# Patient Record
Sex: Female | Born: 1985 | Race: Black or African American | Hispanic: No | Marital: Married | Smoking: Never smoker
Health system: Southern US, Community
[De-identification: ages and names within clinical notes are randomized; demographics above are authoritative.]

## PROBLEM LIST (undated history)

## (undated) DIAGNOSIS — Z789 Other specified health status: Secondary | ICD-10-CM

## (undated) HISTORY — PX: NO PAST SURGERIES: SHX2092

---
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
## 2007-10-17 ENCOUNTER — Inpatient Hospital Stay (HOSPITAL_COMMUNITY): Admission: AD | Admit: 2007-10-17 | Discharge: 2007-10-18 | Payer: Self-pay | Admitting: Obstetrics and Gynecology

## 2007-12-16 ENCOUNTER — Inpatient Hospital Stay (HOSPITAL_COMMUNITY): Admission: AD | Admit: 2007-12-16 | Discharge: 2007-12-19 | Payer: Self-pay | Admitting: Obstetrics

## 2008-10-25 ENCOUNTER — Encounter (INDEPENDENT_AMBULATORY_CARE_PROVIDER_SITE_OTHER): Payer: Self-pay | Admitting: Obstetrics and Gynecology

## 2008-10-25 ENCOUNTER — Inpatient Hospital Stay (HOSPITAL_COMMUNITY): Admission: AD | Admit: 2008-10-25 | Discharge: 2008-10-25 | Payer: Self-pay | Admitting: Obstetrics and Gynecology

## 2010-05-23 LAB — HCG, QUANTITATIVE, PREGNANCY: hCG, Beta Chain, Quant, S: 6378 m[IU]/mL — ABNORMAL HIGH (ref <5)

## 2010-11-18 LAB — CBC
HCT: 37.5
HCT: 34.9 — ABNORMAL LOW
Hemoglobin: 12.6
Hemoglobin: 11.6 — ABNORMAL LOW
MCHC: 33.2
MCV: 89.6
RBC: 4.19
RBC: 3.81 — ABNORMAL LOW
RDW: 13.8
WBC: 10.1

## 2010-11-19 LAB — FETAL FIBRONECTIN: Fetal Fibronectin: NEGATIVE

## 2010-11-19 LAB — URINE CULTURE: Colony Count: >=100000

## 2010-11-19 LAB — URINALYSIS, ROUTINE W REFLEX MICROSCOPIC
Bilirubin Urine: NEGATIVE
Hgb urine dipstick: NEGATIVE
Nitrite: NEGATIVE
Protein, ur: NEGATIVE
Specific Gravity, Urine: <1.005 — ABNORMAL LOW
Urobilinogen, UA: 0.2

## 2010-11-19 LAB — CBC
HCT: 35.2 — ABNORMAL LOW
MCHC: 33.9
MCV: 90.4
RBC: 3.9
WBC: 14.1 — ABNORMAL HIGH

## 2010-11-19 LAB — STREP B DNA PROBE

## 2011-08-25 ENCOUNTER — Emergency Department (HOSPITAL_COMMUNITY)
Admission: EM | Admit: 2011-08-25 | Discharge: 2011-08-25 | Disposition: A | Discharge status: Home / Self Care | Payer: BC Managed Care – PPO | Attending: Emergency Medicine | Admitting: Emergency Medicine

## 2011-08-25 ENCOUNTER — Encounter (HOSPITAL_COMMUNITY): Payer: Self-pay | Admitting: *Deleted

## 2011-08-25 ENCOUNTER — Emergency Department (HOSPITAL_COMMUNITY): Payer: BC Managed Care – PPO

## 2011-08-25 DIAGNOSIS — R079 Chest pain, unspecified: Secondary | ICD-10-CM | POA: Insufficient documentation

## 2011-08-25 DIAGNOSIS — R0789 Other chest pain: Secondary | ICD-10-CM

## 2011-08-25 DIAGNOSIS — R42 Dizziness and giddiness: Secondary | ICD-10-CM | POA: Insufficient documentation

## 2011-08-25 DIAGNOSIS — M25519 Pain in unspecified shoulder: Secondary | ICD-10-CM | POA: Insufficient documentation

## 2011-08-25 DIAGNOSIS — R51 Headache: Secondary | ICD-10-CM

## 2011-08-25 LAB — D-DIMER, QUANTITATIVE: D-Dimer, Quant: 0.38 ug/mL-FEU (ref 0.00–0.48)

## 2011-08-25 MED ORDER — IBUPROFEN 800 MG PO TABS
800.0000 mg | ORAL_TABLET | Freq: Once | ORAL | Status: AC
  Administered 2011-08-25: 800 mg via ORAL
  Filled 2011-08-25: qty 1

## 2011-08-25 NOTE — ED Notes (Signed)
Patient is in radiology.will obtain labs upon patient's return

## 2011-08-25 NOTE — ED Provider Notes (Addendum)
History     CSN: [id number]  Arrival date & time 08/25/11  1724   First MD Initiated Contact with Patient 08/25/11 1831      Chief Complaint  Patient presents with  . Chest Pain  . Shoulder Pain  . Weakness  . Dizziness  . Headache    (Consider location/radiation/quality/duration/timing/severity/associated sxs/prior treatment) Patient is a 26 y.o. female presenting with chest pain, shoulder pain, and headaches. The history is provided by the patient and a parent. No language interpreter was used.  Chest Pain The chest pain began 6 - 12 hours ago. Chest pain occurs constantly. The chest pain is resolved. At its most intense, the pain is at 8/10. The pain is currently at 0/10. The quality of the pain is described as sharp. The pain radiates to the left shoulder. Pertinent negatives for primary symptoms include no wheezing and no palpitations.  Pertinent negatives for associated symptoms include no lower extremity edema, no near-syncope and no numbness. She tried nothing for the symptoms. Risk factors include obesity and lack of exercise (mirana).  Pertinent negatives for past medical history include no DVT, no PE and no strokes.  Her family medical history is significant for CAD in family and heart disease in family.  Pertinent negatives for family medical history include: no early MI in family and no PE in family.  Procedure history is negative for cardiac catheterization, echocardiogram, persantine thallium, stress echo and exercise treadmill test.    Shoulder Pain The problem occurs constantly. The problem has been resolved. Associated symptoms include chest pain. Pertinent negatives include no numbness. She has tried nothing for the symptoms.  Headache  Pertinent negatives include no near-syncope and no palpitations.   26 year old obese female complaining of left chest pain this morning that lasted an hour. States the pain was 8/10 and sharp to the left chest shoulder. Denies calf  pain long trips or shortness of breath. Heart rate 74 nsr. Patient is on the Mirena for birth control. We'll check a d-dimer and a chest x-ray. Around 2:00 patient complained of a headache across her for head. States that she's laid down and the pain got worse. Took nothing for pain. Reports   mild photophobia and dizziness. No n/v.  Neuro in tact. No pmh.   History reviewed. No pertinent past medical history.  History reviewed. No pertinent past surgical history.  No family history on file.  History  Substance Use Topics  . Smoking status: Never Smoker   . Smokeless tobacco: Not on file  . Alcohol Use: No    OB History    Grav Para Term Preterm Abortions TAB SAB Ect Mult Living                  Review of Systems  Constitutional: Negative.   HENT: Negative.   Eyes: Negative.   Respiratory: Negative.  Negative for wheezing.   Cardiovascular: Positive for chest pain. Negative for palpitations, leg swelling and near-syncope.       Prior L chest pain  Gastrointestinal: Negative.   Musculoskeletal: Negative.        L shoulder pain earlier  Neurological: Negative.  Negative for numbness.  Psychiatric/Behavioral: Negative.   All other systems reviewed and are negative.    Allergies  Review of patient's allergies indicates no known allergies.  Home Medications   Current Outpatient Rx  Name Route Sig Dispense Refill  . LEVONORGESTREL 20 MCG/24HR IU IUD Intrauterine 1 each by Intrauterine route once.  BP 143/86  Pulse 74  Temp 98.4 F (36.9 C) (Oral)  Resp 18  SpO2 100%  Physical Exam  Nursing note and vitals reviewed. Constitutional: She is oriented to person, place, and time. She appears well-developed and well-nourished.  HENT:  Head: Normocephalic.  Eyes: Conjunctivae and EOM are normal. Pupils are equal, round, and reactive to light.  Neck: Normal range of motion. Neck supple.  Cardiovascular: Normal rate, regular rhythm and normal heart sounds.     Pulmonary/Chest: Effort normal and breath sounds normal. No respiratory distress.  Abdominal: Soft. Bowel sounds are normal.  Musculoskeletal: Normal range of motion. She exhibits no edema and no tenderness.       No L chest or L shoulder tenderness  Neurological: She is alert and oriented to person, place, and time. She has normal strength and normal reflexes. No cranial nerve deficit or sensory deficit. Coordination normal. GCS eye subscore is 4. GCS verbal subscore is 5. GCS motor subscore is 6.       No acute distress or vision problems  Skin: Skin is warm and dry.  Psychiatric: She has a normal mood and affect.    ED Course  Procedures (including critical care time)   Labs Reviewed  D-DIMER, QUANTITATIVE   No results found.   No diagnosis found.    MDM  Sudden onset of L chest/shoulder  pain that has resolved and headache. EKG unremarkable. Chest x-ray unremarkable. D-dimer negative. Ibuprofen given for headache with relief. Followup PCP as needed. Dizziness has resolved.  Labs Reviewed  D-DIMER, QUANTITATIVE    Date: 08/26/2011  Rate: 74  Rhythm: normal sinus rhythm  QRS Axis: normal  Intervals: normal  ST/T Wave abnormalities: normal  Conduction Disutrbances:none  Narrative Interpretation:   Old EKG Reviewed: none available         Remi Haggard, NP 08/25/11 1945  Remi Haggard, NP 08/27/11 2007

## 2011-08-25 NOTE — ED Notes (Signed)
Pt reports starting this AM she began to have left shoulder and chest pain while getting ready for work lasting for a few hours and then went away. Pt reports this area now feels "weak."  Pt also reports headache starting around 2PM across forehead.  Pt denies taking medication for pain. Pt denies history of similar pains in head or chest.  Pt denies N/V.  Pt reports mild photophobia and dizziness.

## 2011-08-25 NOTE — ED Provider Notes (Signed)
Medical screening examination/treatment/procedure(s) were performed by non-physician practitioner and as supervising physician I was immediately available for consultation/collaboration.  Juliet Rude. Rubin Payor, MD 08/25/11 [phone]

## 2011-08-28 NOTE — ED Provider Notes (Signed)
Medical screening examination/treatment/procedure(s) were performed by non-physician practitioner and as supervising physician I was immediately available for consultation/collaboration.  Shirlean Berman R. Zaleah Ternes, MD 08/28/11 1457 

## 2013-11-14 IMAGING — CR DG CHEST 2V
2 series · 2 of 2 positions shown · non-contrast
Comparison: None.

CLINICAL DATA: Chest pain

CHEST - 2 VIEW

[w chest pa]
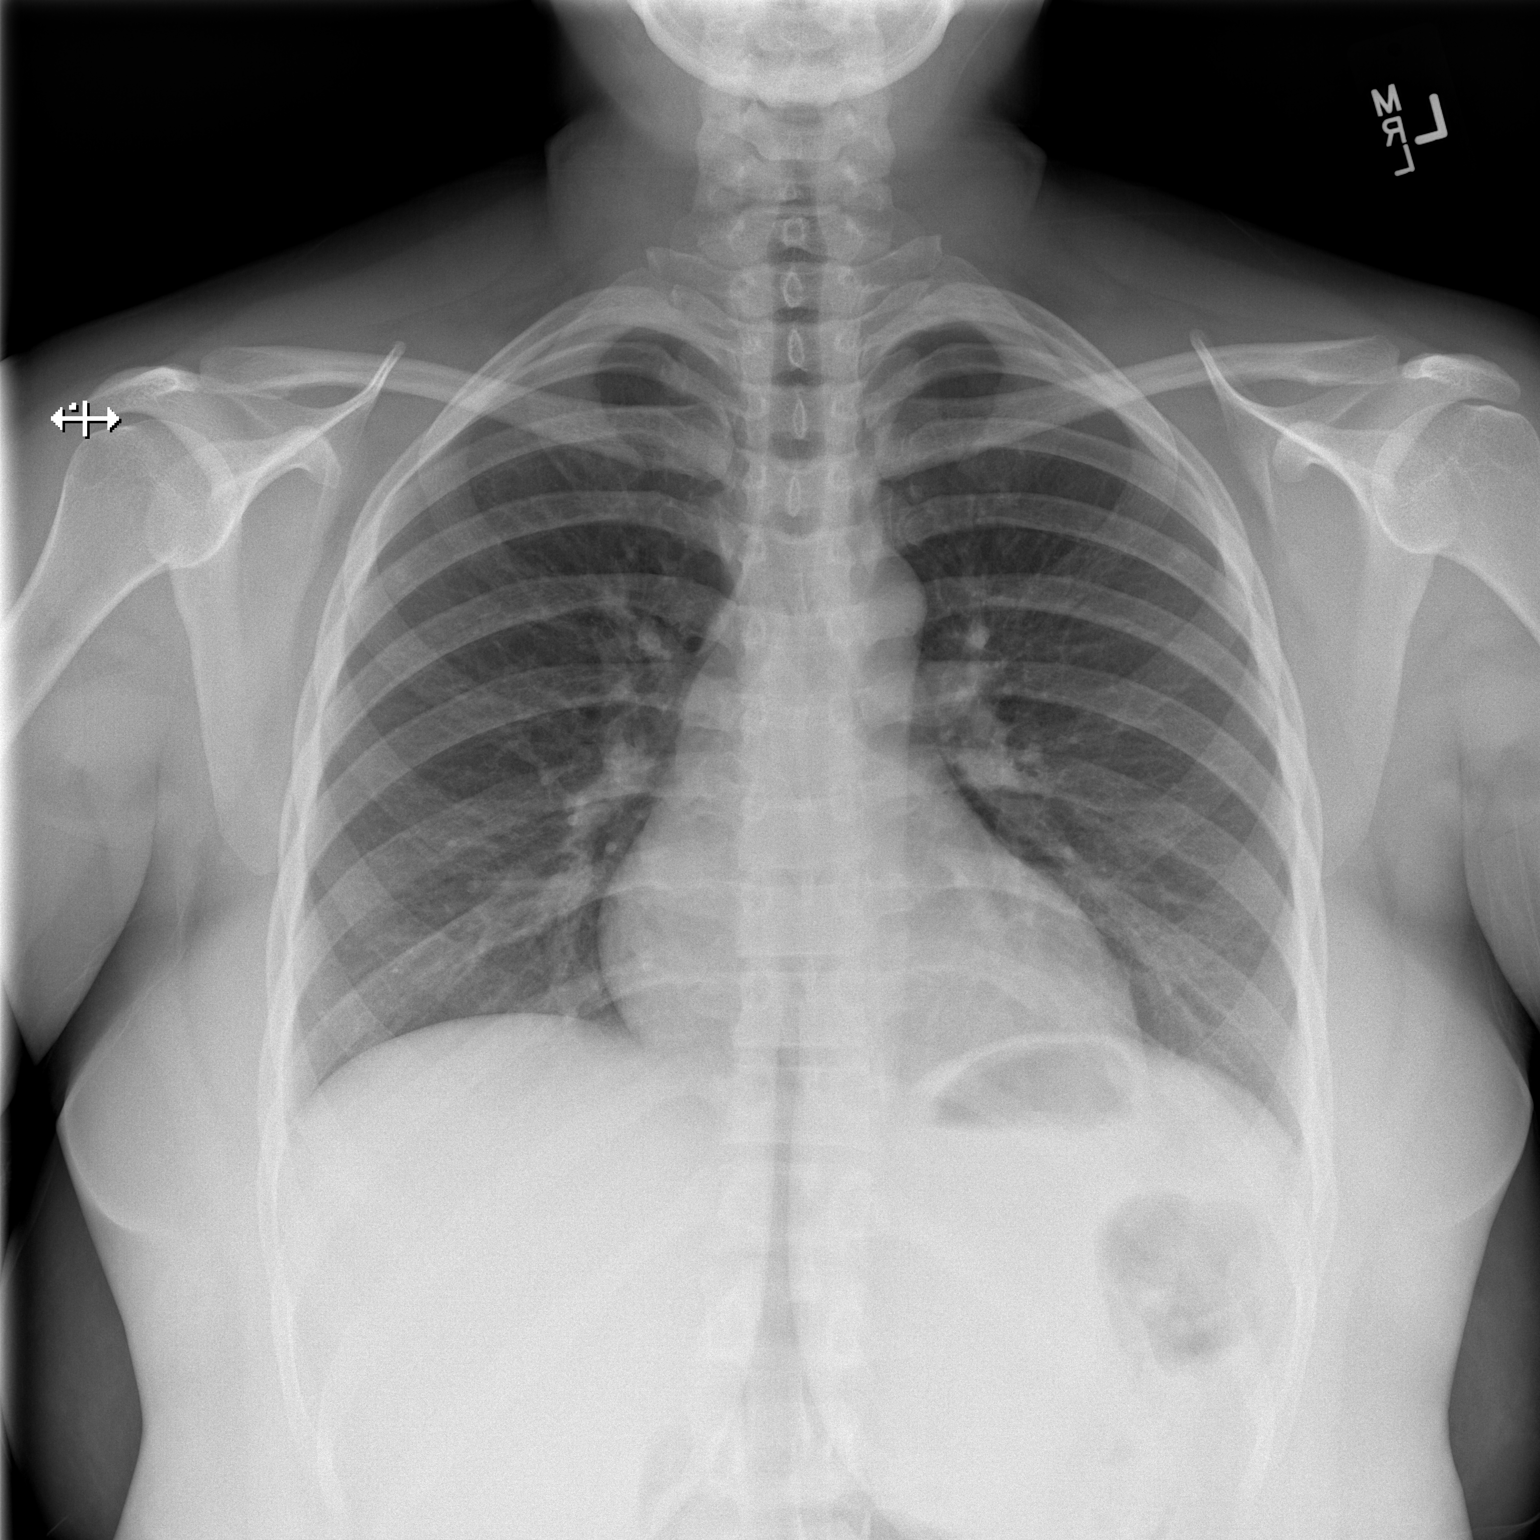

[w chest lat]
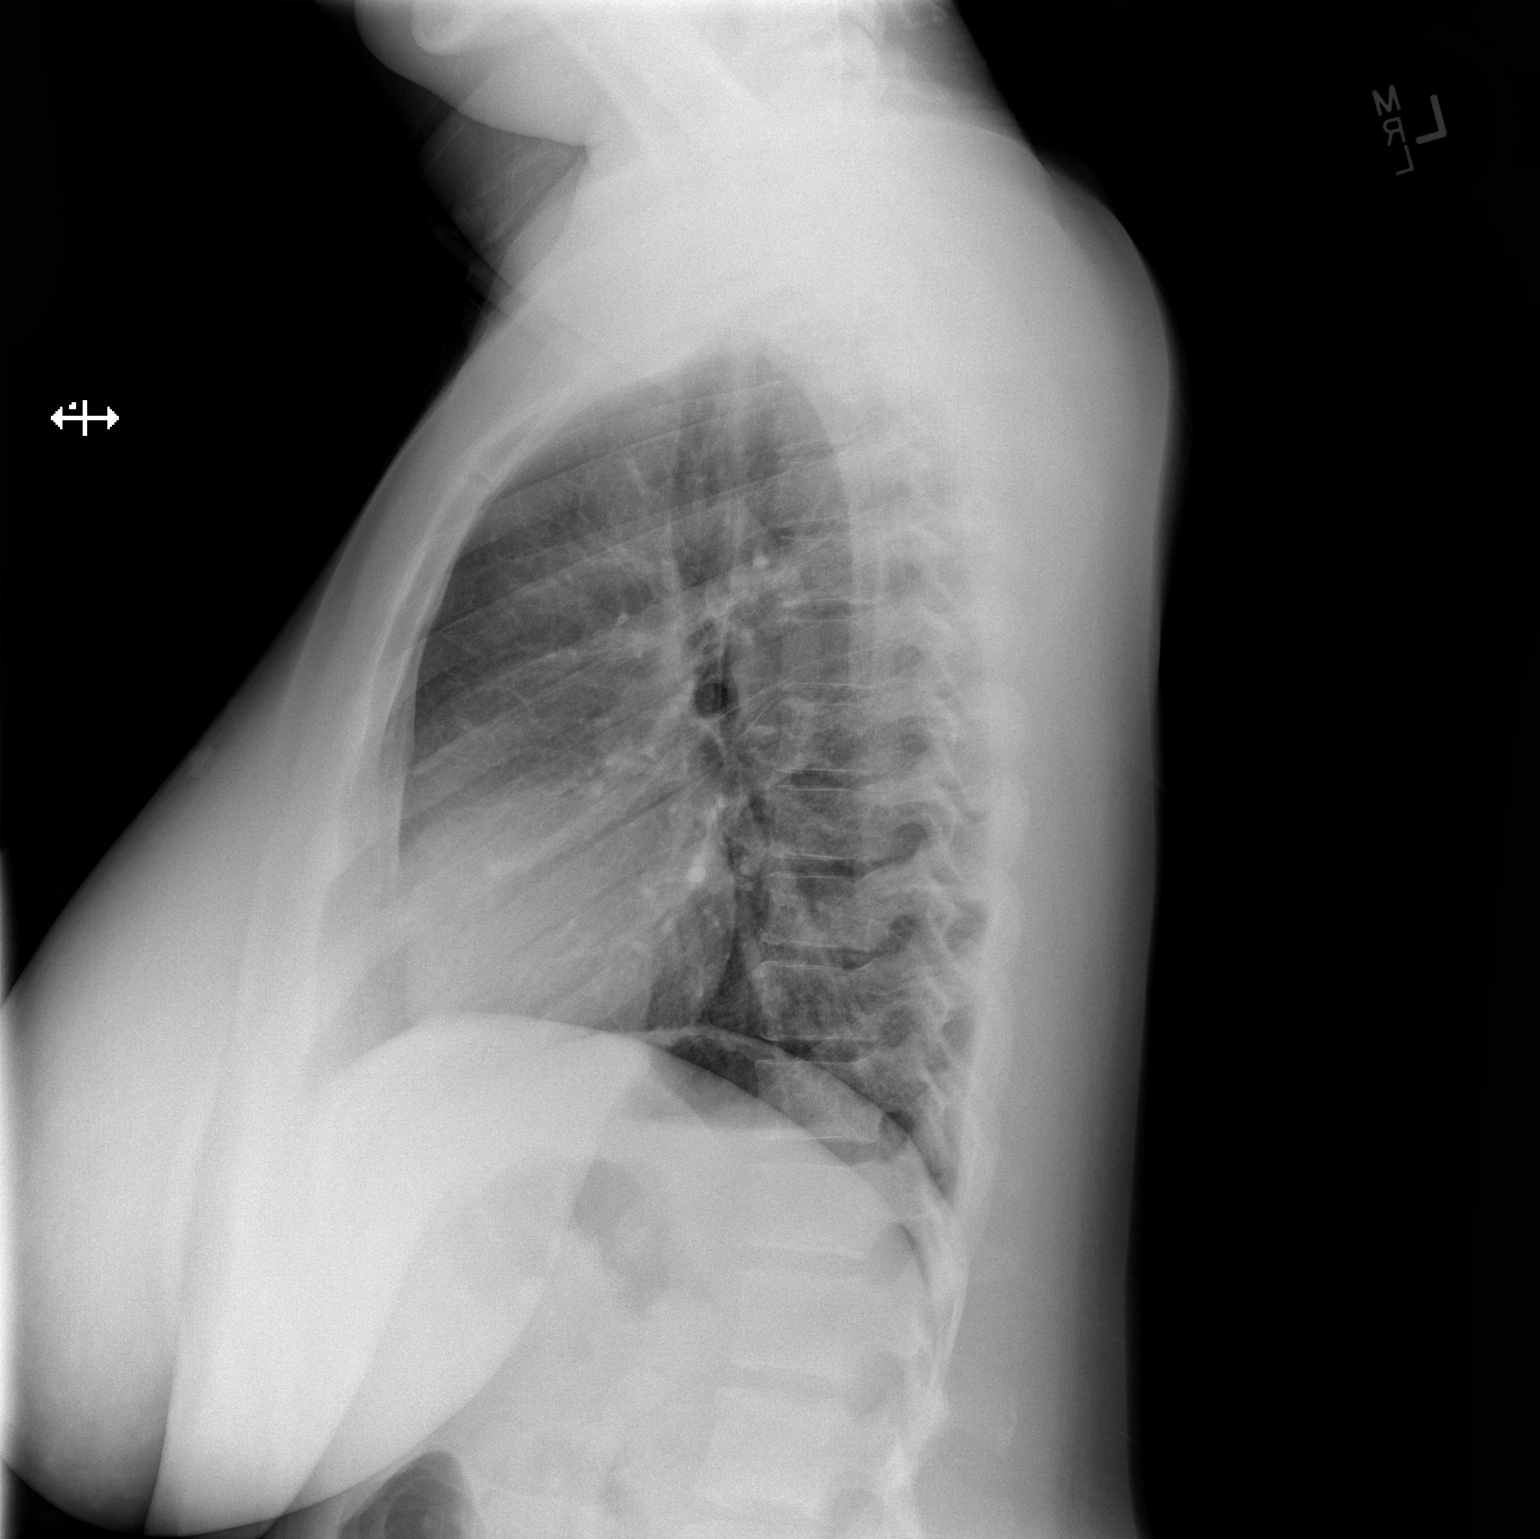

[2 of 2 positions shown; findings below may reference images not displayed]

FINDINGS: Borderline cardiomegaly.  Clear lungs.  No pneumothorax.
No pleural effusion.
IMPRESSION: No active cardiopulmonary disease.

## 2013-12-05 LAB — OB RESULTS CONSOLE ABO/RH: RH TYPE: POSITIVE

## 2013-12-05 LAB — OB RESULTS CONSOLE GC/CHLAMYDIA
Chlamydia: NEGATIVE
Gonorrhea: NEGATIVE

## 2013-12-05 LAB — OB RESULTS CONSOLE HIV ANTIBODY (ROUTINE TESTING): HIV: NONREACTIVE

## 2013-12-05 LAB — OB RESULTS CONSOLE RUBELLA ANTIBODY, IGM: Rubella: IMMUNE

## 2013-12-05 LAB — OB RESULTS CONSOLE ANTIBODY SCREEN: ANTIBODY SCREEN: NEGATIVE

## 2013-12-05 LAB — OB RESULTS CONSOLE HEPATITIS B SURFACE ANTIGEN: Hepatitis B Surface Ag: NEGATIVE

## 2013-12-05 LAB — OB RESULTS CONSOLE RPR: RPR: NONREACTIVE

## 2014-02-16 NOTE — L&D Delivery Note (Signed)
Delivery Note At 4:10 AM a viable and healthy female was delivered via Vaginal, Spontaneous Delivery (Presentation: ; Occiput Anterior).  APGAR: 8, 9; weight 8 lb 4.6 oz (3760 g).   Placenta status: Intact, Spontaneous.  Not sent Cord: 3 vessels with the following complications: None.  Cord pH: none  Anesthesia: Epidural  Episiotomy: None Lacerations: 2nd degree;Perineal Suture Repair: 3.0 chromic Est. Blood Loss (mL): 300  Mom to postpartum.  Baby to Couplet care / Skin to Skin.  Ziya Coonrod A 06/22/2014, 12:19 PM

## 2014-06-06 LAB — OB RESULTS CONSOLE GBS: STREP GROUP B AG: POSITIVE

## 2014-06-21 ENCOUNTER — Encounter (HOSPITAL_COMMUNITY): Payer: Self-pay

## 2014-06-21 ENCOUNTER — Inpatient Hospital Stay (HOSPITAL_COMMUNITY)
Admission: AD | Admit: 2014-06-21 | Discharge: 2014-06-24 | DRG: 775 | Disposition: A | Discharge status: Home / Self Care | Payer: BC Managed Care – PPO | Source: Ambulatory Visit | Attending: Obstetrics and Gynecology | Admitting: Obstetrics and Gynecology

## 2014-06-21 DIAGNOSIS — O3663X0 Maternal care for excessive fetal growth, third trimester, not applicable or unspecified: Secondary | ICD-10-CM | POA: Diagnosis present

## 2014-06-21 DIAGNOSIS — O99824 Streptococcus B carrier state complicating childbirth: Secondary | ICD-10-CM | POA: Diagnosis present

## 2014-06-21 DIAGNOSIS — Z3A38 38 weeks gestation of pregnancy: Secondary | ICD-10-CM | POA: Diagnosis present

## 2014-06-21 DIAGNOSIS — O99214 Obesity complicating childbirth: Secondary | ICD-10-CM | POA: Diagnosis present

## 2014-06-21 DIAGNOSIS — Z6841 Body Mass Index (BMI) 40.0 and over, adult: Secondary | ICD-10-CM

## 2014-06-21 DIAGNOSIS — IMO0001 Reserved for inherently not codable concepts without codable children: Secondary | ICD-10-CM

## 2014-06-21 HISTORY — DX: Other specified health status: Z78.9

## 2014-06-21 MED ORDER — TERBUTALINE SULFATE 1 MG/ML IJ SOLN: 0.2500 mg | Freq: Once | INTRAMUSCULAR | Status: AC | PRN

## 2014-06-21 MED ORDER — OXYTOCIN 40 UNITS IN LACTATED RINGERS INFUSION - SIMPLE MED: 62.5000 mL/h | INTRAVENOUS | Status: DC

## 2014-06-21 MED ORDER — ACETAMINOPHEN 325 MG PO TABS: 650.0000 mg | ORAL_TABLET | ORAL | Status: DC | PRN

## 2014-06-21 MED ORDER — CITRIC ACID-SODIUM CITRATE 334-500 MG/5ML PO SOLN: 30.0000 mL | ORAL | Status: DC | PRN

## 2014-06-21 MED ORDER — ONDANSETRON HCL 4 MG/2ML IJ SOLN: 4.0000 mg | Freq: Four times a day (QID) | INTRAMUSCULAR | Status: DC | PRN

## 2014-06-21 MED ORDER — OXYCODONE-ACETAMINOPHEN 5-325 MG PO TABS
2.0000 | ORAL_TABLET | ORAL | Status: DC | PRN
Start: 2014-06-21 — End: 2014-06-22

## 2014-06-21 MED ORDER — OXYTOCIN BOLUS FROM INFUSION
500.0000 mL | INTRAVENOUS | Status: DC
Start: 2014-06-22 — End: 2014-06-22
  Administered 2014-06-22: 500 mL via INTRAVENOUS

## 2014-06-21 MED ORDER — LACTATED RINGERS IV SOLN
INTRAVENOUS | Status: DC
  Administered 2014-06-22: 01:00:00 via INTRAVENOUS

## 2014-06-21 MED ORDER — LACTATED RINGERS IV SOLN
500.0000 mL | INTRAVENOUS | Status: DC | PRN
  Administered 2014-06-22: 500 mL via INTRAVENOUS

## 2014-06-21 MED ORDER — OXYTOCIN 10 UNIT/ML IJ SOLN: 10.0000 [IU] | Freq: Once | INTRAMUSCULAR | Status: DC

## 2014-06-21 MED ORDER — OXYCODONE-ACETAMINOPHEN 5-325 MG PO TABS
1.0000 | ORAL_TABLET | ORAL | Status: DC | PRN
Start: 2014-06-21 — End: 2014-06-22

## 2014-06-21 MED ORDER — SODIUM CHLORIDE 0.9 % IV SOLN
2.0000 g | Freq: Once | INTRAVENOUS | Status: AC
  Administered 2014-06-22: 2 g via INTRAVENOUS
  Filled 2014-06-21: qty 2000

## 2014-06-21 MED ORDER — OXYTOCIN 40 UNITS IN LACTATED RINGERS INFUSION - SIMPLE MED
1.0000 m[IU]/min | INTRAVENOUS | Status: DC
  Administered 2014-06-22: 2 m[IU]/min via INTRAVENOUS
  Filled 2014-06-21: qty 1000

## 2014-06-21 MED ORDER — LIDOCAINE HCL (PF) 1 % IJ SOLN
30.0000 mL | INTRAMUSCULAR | Status: AC | PRN
  Administered 2014-06-22: 30 mL via SUBCUTANEOUS
  Filled 2014-06-21: qty 30

## 2014-06-21 NOTE — Progress Notes (Signed)
Notified Dr. Cherly Hensenousins patient G2P1 [id number] only 2 contractions since in MAU, fhr reactive, 3/70/-3 posterior was checked by another nurse, patient may walk for one hour and be rechecked or go home.

## 2014-06-21 NOTE — H&P (Signed)
Natalie English is a 29 y.o. female presenting @ 8438 5/7weeks in active labor. PNC complicated by fetal macrosomia. GBS cx (+) intact membrane. (+)) ctx  Maternal Medical History:  Reason for admission: Contractions.   Contractions: Onset was 3-5 hours ago.   Frequency: irregular.    Fetal activity: Perceived fetal activity is normal.    Prenatal complications: Fetal macrosomia    OB History    Gravida Para Term Preterm AB TAB SAB Ectopic Multiple Living   41 1 1   1 1   1      Past Medical History  Diagnosis Date  . Medical history non-contributory    Past Surgical History  Procedure Laterality Date  . No past surgeries     Family History: family history is not on file. Social History:  reports that she has never smoked. She does not have any smokeless tobacco history on file. She reports that she does not drink alcohol or use illicit drugs.   Prenatal Transfer Tool  Maternal Diabetes: No Genetic Screening: Normal Maternal Ultrasounds/Referrals: Normal Fetal Ultrasounds or other Referrals:  None Maternal Substance Abuse:  No Significant Maternal Medications:  None Significant Maternal Lab Results:  Lab values include: Group B Strep positive Other Comments:  fetal macrosomia  Review of Systems  All other systems reviewed and are negative.   Dilation: 4 Effacement (%): 80 Station: -2 Exam by:: Dr. Cherly Hensenousins Blood pressure 151/77, pulse 85, temperature 98.5 F (36.9 C), temperature source Oral, resp. rate 16, height 5\' 4"  (1.626 m), weight 117.935 kg (260 lb). Maternal Exam:  Uterine Assessment: Contraction strength is moderate.  Abdomen: Patient reports no abdominal tenderness. Estimated fetal weight is 9 lb 2 oz.   Fetal presentation: vertex  Introitus: Normal vulva. Ferning test: not done.   Pelvis: adequate for delivery.   Cervix: Cervix evaluated by digital exam.     Physical Exam  Constitutional: She is oriented to person, place, and time. She appears  well-developed and well-nourished.  HENT:  Head: Normocephalic and atraumatic.  Eyes: EOM are normal.  Neck: Neck supple.  Cardiovascular: Normal rate and regular rhythm.   Respiratory: Effort normal and breath sounds normal.  GI: Soft.  Musculoskeletal: She exhibits edema.  Neurological: She is alert and oriented to person, place, and time.  Skin: Skin is warm and dry.  Psychiatric: She has a normal mood and affect.    Prenatal labs: ABO, Rh:   B positive Antibody:  neg Rubella:  Immune RPR:   NR HBsAg:  neg  HIV:   neg GBS:   positive  Assessment/Plan: Active labor GBS cx (+) Fetal macrosomia IUP @ 38 5/7 weeks P) admit. Routine labs. Pitocin augmentation, IV PCN. Epidural. amniotomy  [name] A 06/21/2014, 11:37 PM

## 2014-06-21 NOTE — MAU Note (Signed)
Regular contractions since 7, closed in office on Tuesday, denies vaginal bleeding or leaking.

## 2014-06-21 NOTE — Progress Notes (Signed)
Patient walked for one hour, placed efm, informed patient will have Judithe ModestBridget Mosca RN recheck her cervix as she had checked her previously. Family at bedside.

## 2014-06-22 ENCOUNTER — Inpatient Hospital Stay (HOSPITAL_COMMUNITY): Payer: BC Managed Care – PPO | Admitting: Anesthesiology

## 2014-06-22 ENCOUNTER — Encounter (HOSPITAL_COMMUNITY): Payer: Self-pay | Admitting: *Deleted

## 2014-06-22 LAB — CBC
HCT: 35.8 % — ABNORMAL LOW (ref 36.0–46.0)
Hemoglobin: 12.3 g/dL (ref 12.0–15.0)
MCH: 28.5 pg (ref 26.0–34.0)
MCHC: 34.4 g/dL (ref 30.0–36.0)
MCV: 82.9 fL (ref 78.0–100.0)
PLATELETS: 254 10*3/uL (ref 150–400)
RBC: 4.32 MIL/uL (ref 3.87–5.11)
RDW: 14.3 % (ref 11.5–15.5)
WBC: 9.6 10*3/uL (ref 4.0–10.5)

## 2014-06-22 LAB — RPR: RPR: NONREACTIVE

## 2014-06-22 MED ORDER — DIBUCAINE 1 % RE OINT
1.0000 | TOPICAL_OINTMENT | RECTAL | Status: DC | PRN
Start: 2014-06-22 — End: 2014-06-25

## 2014-06-22 MED ORDER — ZOLPIDEM TARTRATE 5 MG PO TABS: 5.0000 mg | ORAL_TABLET | Freq: Every evening | ORAL | Status: DC | PRN

## 2014-06-22 MED ORDER — PHENYLEPHRINE 40 MCG/ML (10ML) SYRINGE FOR IV PUSH (FOR BLOOD PRESSURE SUPPORT)
80.0000 ug | PREFILLED_SYRINGE | INTRAVENOUS | Status: DC | PRN
  Filled 2014-06-22: qty 20
  Filled 2014-06-22: qty 2

## 2014-06-22 MED ORDER — OXYCODONE-ACETAMINOPHEN 5-325 MG PO TABS: 1.0000 | ORAL_TABLET | ORAL | Status: DC | PRN

## 2014-06-22 MED ORDER — ACETAMINOPHEN 325 MG PO TABS: 650.0000 mg | ORAL_TABLET | ORAL | Status: DC | PRN

## 2014-06-22 MED ORDER — SENNOSIDES-DOCUSATE SODIUM 8.6-50 MG PO TABS
2.0000 | ORAL_TABLET | ORAL | Status: DC
  Administered 2014-06-23 (×2): 2 via ORAL
  Filled 2014-06-22 (×2): qty 2

## 2014-06-22 MED ORDER — BENZOCAINE-MENTHOL 20-0.5 % EX AERO
1.0000 | INHALATION_SPRAY | CUTANEOUS | Status: DC | PRN
Start: 2014-06-22 — End: 2014-06-25

## 2014-06-22 MED ORDER — ONDANSETRON HCL 4 MG/2ML IJ SOLN: 4.0000 mg | INTRAMUSCULAR | Status: DC | PRN

## 2014-06-22 MED ORDER — FERROUS SULFATE 325 (65 FE) MG PO TABS
325.0000 mg | ORAL_TABLET | Freq: Two times a day (BID) | ORAL | Status: DC
  Administered 2014-06-22 – 2014-06-23 (×3): 325 mg via ORAL
  Filled 2014-06-22 (×3): qty 1

## 2014-06-22 MED ORDER — LIDOCAINE HCL (PF) 1 % IJ SOLN
INTRAMUSCULAR | Status: DC | PRN
  Administered 2014-06-22 (×2): 8 mL

## 2014-06-22 MED ORDER — PRENATAL MULTIVITAMIN CH
1.0000 | ORAL_TABLET | Freq: Every day | ORAL | Status: DC
  Administered 2014-06-22 – 2014-06-24 (×3): 1 via ORAL
  Filled 2014-06-22 (×3): qty 1

## 2014-06-22 MED ORDER — WITCH HAZEL-GLYCERIN EX PADS: 1.0000 "application " | MEDICATED_PAD | CUTANEOUS | Status: DC | PRN

## 2014-06-22 MED ORDER — DIPHENHYDRAMINE HCL 25 MG PO CAPS: 25.0000 mg | ORAL_CAPSULE | Freq: Four times a day (QID) | ORAL | Status: DC | PRN

## 2014-06-22 MED ORDER — SIMETHICONE 80 MG PO CHEW: 80.0000 mg | CHEWABLE_TABLET | ORAL | Status: DC | PRN

## 2014-06-22 MED ORDER — ONDANSETRON HCL 4 MG PO TABS: 4.0000 mg | ORAL_TABLET | ORAL | Status: DC | PRN

## 2014-06-22 MED ORDER — OXYCODONE-ACETAMINOPHEN 5-325 MG PO TABS: 2.0000 | ORAL_TABLET | ORAL | Status: DC | PRN

## 2014-06-22 MED ORDER — EPHEDRINE 5 MG/ML INJ
10.0000 mg | INTRAVENOUS | Status: DC | PRN
  Filled 2014-06-22: qty 2

## 2014-06-22 MED ORDER — DIPHENHYDRAMINE HCL 50 MG/ML IJ SOLN: 12.5000 mg | INTRAMUSCULAR | Status: DC | PRN

## 2014-06-22 MED ORDER — LANOLIN HYDROUS EX OINT: TOPICAL_OINTMENT | CUTANEOUS | Status: DC | PRN

## 2014-06-22 MED ORDER — IBUPROFEN 600 MG PO TABS
600.0000 mg | ORAL_TABLET | Freq: Four times a day (QID) | ORAL | Status: DC
  Administered 2014-06-22 – 2014-06-24 (×10): 600 mg via ORAL
  Filled 2014-06-22 (×10): qty 1

## 2014-06-22 MED ORDER — FENTANYL 2.5 MCG/ML BUPIVACAINE 1/10 % EPIDURAL INFUSION (WH - ANES)
14.0000 mL/h | INTRAMUSCULAR | Status: DC | PRN
  Administered 2014-06-22: 14 mL/h via EPIDURAL
  Filled 2014-06-22: qty 125

## 2014-06-22 NOTE — Anesthesia Postprocedure Evaluation (Signed)
Anesthesia Post Note  Patient: Natalie English  Procedure(s) Performed: * No procedures listed *  Anesthesia type: Epidural  Patient location: Mother/Baby  Post pain: Pain level controlled  Post assessment: Post-op Vital signs reviewed  Last Vitals:  Filed Vitals:   06/22/14 0750  BP: 143/60  Pulse: 80  Temp: 36.8 C  Resp:     Post vital signs: Reviewed  Level of consciousness:alert  Complications: No apparent anesthesia complications

## 2014-06-22 NOTE — Lactation Note (Signed)
This note was copied from the chart of Natalie English Mckeehan. Lactation Consultation Note  Patient Name: Natalie English Spinney NGEXB'MToday's Date: 06/22/2014 Reason for consult: Follow-up assessment Baby 10 hours of life. Mom called out for assistance with latch. Mom return-demonstrated hand expression with a small amount of colostrum visible at left nipple. Mom has large pendulous breasts with large, tough nipples. Attempted to latch baby directly to nipple, but nipple too large for baby's mouth and baby kept sucking/thrusting her tongue. Mom states that the NS she had used earlier felt too tight. Refitted mom with a #24 NS and assisted to latch baby in football position to left breast. Baby latched deeply, suckling rhythmically, but no swallows noted. Mom states improved comfort with larger NS. Discussed that NS a bridge to getting baby directly to breast, and the need to keep trying to latch baby without shield. Also enc asking for assistance with latch in order to determine if baby transferring colostrum through shield. Enc mom to offer lots of STS and nurse with cues. Enc mom to call out for assistance with latching/nursing as needed.   Discussed assessment, interventions, and plan with patient's RN, Morrie SheldonAshley.  Maternal Data Has patient been taught Hand Expression?: Yes Does the patient have breastfeeding experience prior to this delivery?: No  Feeding Feeding Type: Breast Fed Length of feed:  (LC assessed first 10 minutes of BF.)  LATCH Score/Interventions Latch: Grasps breast easily, tongue down, lips flanged, rhythmical sucking. Intervention(s): Skin to skin;Waking techniques;Teach feeding cues Intervention(s): Adjust position;Assist with latch;Breast compression  Audible Swallowing: None Intervention(s): Skin to skin;Hand expression  Type of Nipple: Everted at rest and after stimulation  Comfort (Breast/Nipple): Soft / non-tender     Hold (Positioning): Assistance needed to correctly  position infant at breast and maintain latch. Intervention(s): Breastfeeding basics reviewed;Support Pillows;Position options;Skin to skin  LATCH Score: 7  Lactation Tools Discussed/Used Tools: Nipple Shields Nipple shield size: 24   Consult Status Consult Status: Follow-up Date: 06/23/14 Follow-up type: In-patient    Geralynn OchsWILLIARD, Aleck Locklin 06/22/2014, 3:07 PM

## 2014-06-22 NOTE — Anesthesia Preprocedure Evaluation (Signed)
Anesthesia Evaluation  Patient identified by MRN, date of birth, ID band Patient awake    Reviewed: Allergy & Precautions, H&P , NPO status , Patient's Chart, lab work & pertinent test results  Airway Mallampati: II  TM Distance: >3 FB Neck ROM: full    Dental no notable dental hx.    Pulmonary neg pulmonary ROS,    Pulmonary exam normal        Cardiovascular negative cardio ROS Normal cardiovascular exam     Neuro/Psych negative neurological ROS  negative psych ROS   GI/Hepatic negative GI ROS, Neg liver ROS,   Endo/Other  Morbid obesity  Renal/GU negative Renal ROS     Musculoskeletal   Abdominal (+) + obese,   Peds  Hematology negative hematology ROS (+)   Anesthesia Other Findings   Reproductive/Obstetrics (+) Pregnancy                             Anesthesia Physical Anesthesia Plan  ASA: III  Anesthesia Plan: Epidural   Post-op Pain Management:    Induction:   Airway Management Planned:   Additional Equipment:   Intra-op Plan:   Post-operative Plan:   Informed Consent: I have reviewed the patients History and Physical, chart, labs and discussed the procedure including the risks, benefits and alternatives for the proposed anesthesia with the patient or authorized representative who has indicated his/her understanding and acceptance.     Plan Discussed with:   Anesthesia Plan Comments:         Anesthesia Quick Evaluation  

## 2014-06-22 NOTE — Anesthesia Procedure Notes (Signed)
Epidural Patient location during procedure: OB Start time: 06/22/2014 1:52 AM End time: 06/22/2014 1:56 AM  Staffing Anesthesiologist: Leilani AbleHATCHETT, Jaidee Stipe Performed by: anesthesiologist   Preanesthetic Checklist Completed: patient identified, surgical consent, pre-op evaluation, timeout performed, IV checked, risks and benefits discussed and monitors and equipment checked  Epidural Patient position: sitting Prep: site prepped and draped and DuraPrep Patient monitoring: continuous pulse ox and blood pressure Approach: midline Location: L3-L4 Injection technique: LOR air  Needle:  Needle type: Tuohy  Needle gauge: 17 G Needle length: 9 cm and 9 Needle insertion depth: 8 cm Catheter type: closed end flexible Catheter size: 19 Gauge Catheter at skin depth: 13 cm Test dose: negative and Other  Assessment Sensory level: T9 Events: blood not aspirated, injection not painful, no injection resistance, negative IV test and no paresthesia  Additional Notes Reason for block:procedure for pain

## 2014-06-22 NOTE — Progress Notes (Signed)
S: pushing  O: Pitocin 6 miu VE fully +1 station  Tracing baseline 120 (+) variables with each ctx Ctx q 2 mins  IMP: complete Variable deceleration due to cord compression P) cont pushing may need assistance

## 2014-06-22 NOTE — Lactation Note (Signed)
This note was copied from the chart of Natalie English. Lactation Consultation Note  Initial visit made.  Breastfeeding consultation services and support information given to patient.  Baby is 8 hours old and has had some difficulty latching.  RN initiated a 20 mm nipple shield and mom states baby nursed for 15 minutes.  Instructed on feeding cues and to call out for latch assessment with next feeding.  Patient Name: Natalie Dimple NanasShannon Mesick ZOXWR'UToday's Date: 06/22/2014 Reason for consult: Initial assessment   Maternal Data    Feeding Feeding Type: Breast Fed  LATCH Score/Interventions Latch: Repeated attempts needed to sustain latch, nipple held in mouth throughout feeding, stimulation needed to elicit sucking reflex. Intervention(s): Skin to skin;Waking techniques Intervention(s): Adjust position;Assist with latch  Audible Swallowing: None Intervention(s): Skin to skin;Hand expression  Type of Nipple: Everted at rest and after stimulation (large nipple)  Comfort (Breast/Nipple): Soft / non-tender     Hold (Positioning): Assistance needed to correctly position infant at breast and maintain latch. Intervention(s): Breastfeeding basics reviewed;Support Pillows;Skin to skin  LATCH Score: 6  Lactation Tools Discussed/Used Tools: Nipple Shields   Consult Status Consult Status: Follow-up Date: 06/23/14 Follow-up type: In-patient    Huston FoleyMOULDEN, Patsey Pitstick S 06/22/2014, 12:21 PM

## 2014-06-22 NOTE — Progress Notes (Signed)
Patient ID: Natalie NanasShannon Moxley, female   DOB: 1985-04-11, 29 y.o.   MRN: [id number] INTERVAL NOTE:  S:   Sitting in bed, skin-to-skin with infant, min cramping, (+) voids, small bleed, denies HA/NV/dizziness  O:   VSS, AAO x 3, NAD  U-even  Scant lochia  A / P:   PPD #0 / S/P Vaginal Delivery with 2nd degree laceration  Stable post partum  Routine PP orders  Kenard GowerAWSON, Ryley Bachtel, M, MSN, CNM 06/22/2014, 10:55 AM

## 2014-06-23 LAB — CBC
HEMATOCRIT: 31.3 % — AB (ref 36.0–46.0)
HEMOGLOBIN: 11.2 g/dL — AB (ref 12.0–15.0)
MCH: 30 pg (ref 26.0–34.0)
MCHC: 35.8 g/dL (ref 30.0–36.0)
MCV: 83.9 fL (ref 78.0–100.0)
Platelets: 230 10*3/uL (ref 150–400)
RBC: 3.73 MIL/uL — AB (ref 3.87–5.11)
RDW: 14.6 % (ref 11.5–15.5)
WBC: 9.5 10*3/uL (ref 4.0–10.5)

## 2014-06-23 NOTE — Lactation Note (Signed)
This note was copied from the chart of Natalie Dimple NanasShannon Parslow. Lactation Consultation Note  Patient Name: Natalie English ZOXWR'UToday's Date: 06/23/2014 Reason for consult: Follow-up assessment;Difficult latch  Mom reports baby is not latching with or without the nipple shield. Mom has large pendulous breasts with large flat nipples. Mom has decided to pump/bottle feed for now. DEBP set up for Mom to use, flange changed to size 30. Mom to pump every 3 hours on preemie setting for 15 minutes to encourage milk production. Advised to give baby back any amount of EBM she receives. Advised to review storage guidelines of page 25 of Baby N Me Booklet. Mom will continue to supplement with formula according to guidelines till her milk comes in. Mom will need 2 week rental at d/c.  Maternal Data    Feeding Feeding Type: Bottle Fed - Formula Nipple Type: Slow - flow  LATCH Score/Interventions                      Lactation Tools Discussed/Used Tools: Pump;Nipple Shields Nipple shield size: 24 Breast pump type: Double-Electric Breast Pump Pump Review: Setup, frequency, and cleaning;Milk Storage Initiated by:: KG Date initiated:: 06/23/14   Consult Status Consult Status: Follow-up Date: 06/24/14 Follow-up type: In-patient    Alfred LevinsGranger, Jadie Allington Ann 06/23/2014, 7:50 PM

## 2014-06-24 MED ORDER — IBUPROFEN 600 MG PO TABS: 600.0000 mg | ORAL_TABLET | Freq: Four times a day (QID) | ORAL | Status: DC

## 2014-06-24 NOTE — Lactation Note (Signed)
This note was copied from the chart of Natalie English. Lactation Consultation Note  Patient Name: Natalie English WUJWJ'XToday's Date: 06/24/2014 Reason for consult: Follow-up assessment;Pump rental;NICU baby;Difficult latch Baby admitted to NICU after 2 apneic/dusky episodes on MBU. Mom plans pump/bottle feed. Mom d/c today. 2 week pump rental completed for Mom. Encouraged Mom to pump every 3 hours for 15 minutes to encourage milk production, prevent engorgement and protect milk supply. Mom reports 30 flanges comfortable. Mom receiving few drops of colostrum with pumping. NICU booklet given to Mom, advised of new storage guideline for the NICU. Mom denies other questions/concerns.   Maternal Data    Feeding    LATCH Score/Interventions                      Lactation Tools Discussed/Used Tools: Pump;Flanges;Nipple Shields Nipple shield size: 24 Flange Size: 30 Breast pump type: Double-Electric Breast Pump   Consult Status Consult Status: Follow-up Date: 06/24/14 Follow-up type: In-patient    Natalie English, Natalie English 06/24/2014, 6:35 PM

## 2014-06-24 NOTE — Progress Notes (Signed)
PPD #2- SVD  Subjective:   Reports feeling ok, teary-baby taken to NICU for apnea episodes Tolerating po/ No nausea or vomiting Bleeding is light Pain controlled with Motrin Up ad lib / ambulatory / voiding without problems Newborn: breastpumping and formula feeding     Objective:   VS: VS:  Filed Vitals:   06/22/14 1900 06/23/14 0550 06/23/14 1825 06/24/14 0536  BP: 138/59 127/69 93/70 127/64  Pulse: 98 87 91 77  Temp: 98.2 F (36.8 C) 98.1 F (36.7 C) 98.3 F (36.8 C) 98.2 F (36.8 C)  TempSrc: Oral Axillary Oral Oral  Resp: 18 18 18 18   Height:      Weight:      SpO2: 98%       LABS:  Recent Labs  06/22/14 0025 06/23/14 0540  WBC 9.6 9.5  HGB 12.3 11.2*  PLT 254 230   Blood type: B/Positive/-- (10/20 0000) Rubella: Immune (10/20 0000)                I&O: Intake/Output    None     Physical Exam: Alert and oriented X3 Abdomen: soft, non-tender, non-distended  Fundus: firm, non-tender, U-1 Perineum: Well approximated, no significant erythema, edema, or drainage; healing well. Lochia: small Extremities: No edema, no calf pain or tenderness    Assessment: PPD #2  G2P2001/ S/P:spontaneous vaginal Doing well - stable for discharge home   Plan: Discharge home-may room in if availability RX's:  Ibuprofen 600mg  po Q 6 hrs prn pain #30 Refill x 0 Routine pp visit in [employer] booklet given    Natalie English, Natalie English, N MSN, CNM 06/24/2014, 12:31 PM

## 2014-06-24 NOTE — Discharge Summary (Signed)
Obstetric Discharge Summary Reason for Admission: onset of labor Prenatal Procedures: suspected macrosomia, GBS positive Intrapartum Procedures: GBS prophylaxis and epidural, AROM, Pitocin augmentation Postpartum Procedures: none Complications-Operative and Postpartum: 2nd degree perineal laceration HEMOGLOBIN  Date Value Ref Range Status  06/23/2014 11.2* 12.0 - 15.0 g/dL Final   HCT  Date Value Ref Range Status  06/23/2014 31.3* 36.0 - 46.0 % Final    Physical Exam:  General: alert and cooperative Lochia: appropriate Uterine Fundus: firm Incision: healing well, no significant drainage, no dehiscence, no significant erythema DVT Evaluation: No evidence of DVT seen on physical exam. Negative Homan's sign. No cords or calf tenderness. No significant calf/ankle edema.  Discharge Diagnoses: Term Pregnancy-delivered  Discharge Information: Date: 06/24/2014 Activity: pelvic rest Diet: routine Medications: PNV and Ibuprofen Condition: stable Instructions: refer to practice specific booklet Discharge to: home Follow-up Information    Follow up with Timira Bieda A, MD. Schedule an appointment as soon as possible for a visit in 6 weeks.   Specialty:  Obstetrics and Gynecology   Contact information:   [address] [place] [id number] [phone]       Newborn Data: Live born female on 06/22/14 Birth Weight: 8 lb 4.6 oz (3760 g) APGAR: 8, 9  NICU-stable  BHAMBRI, MELANIE, N 06/24/2014, 12:34 PM

## 2014-06-28 ENCOUNTER — Ambulatory Visit: Payer: Self-pay

## 2014-06-28 NOTE — Lactation Note (Signed)
This note was copied from the chart of Natalie English. Lactation Consultation Note  Patient Name: Natalie English ZOXWR'UToday's Date: 06/28/2014 Reason for consult: Follow-up assessment;NICU baby  NICU baby 6 days of life. Mom roomed in with baby overnight. Mom states that her milk is increasing, and she is continuing to pump every 3 hours/8 times a day, sleeping for 5-hour stretch at night. Mom states that baby was awake and eating often during the night. Mom's plan is to continue to pump and bottle-feed EBM/formula. Mom aware of OP/BFSG and LC phone line assistance after D/C. Maternal Data    Feeding Feeding Type: Formula Nipple Type: Regular Length of feed: 20 min  LATCH Score/Interventions                      Lactation Tools Discussed/Used     Consult Status Consult Status: PRN    Geralynn OchsWILLIARD, Reiley Bertagnolli 06/28/2014, 9:13 AM

## 2014-07-10 ENCOUNTER — Encounter (HOSPITAL_COMMUNITY)
Admission: RE | Admit: 2014-07-10 | Discharge: 2014-07-10 | Disposition: A | Discharge status: Home / Self Care | Payer: BC Managed Care – PPO | Source: Ambulatory Visit | Attending: Obstetrics and Gynecology | Admitting: Obstetrics and Gynecology

## 2014-11-18 ENCOUNTER — Encounter (HOSPITAL_COMMUNITY): Payer: Self-pay | Admitting: Oncology

## 2014-11-18 ENCOUNTER — Emergency Department (HOSPITAL_COMMUNITY)
Admission: EM | Admit: 2014-11-18 | Discharge: 2014-11-18 | Disposition: A | Discharge status: Home / Self Care | Payer: BC Managed Care – PPO | Attending: Emergency Medicine | Admitting: Emergency Medicine

## 2014-11-18 DIAGNOSIS — Y9289 Other specified places as the place of occurrence of the external cause: Secondary | ICD-10-CM | POA: Insufficient documentation

## 2014-11-18 DIAGNOSIS — W51XXXA Accidental striking against or bumped into by another person, initial encounter: Secondary | ICD-10-CM | POA: Diagnosis not present

## 2014-11-18 DIAGNOSIS — Y998 Other external cause status: Secondary | ICD-10-CM | POA: Insufficient documentation

## 2014-11-18 DIAGNOSIS — S0502XA Injury of conjunctiva and corneal abrasion without foreign body, left eye, initial encounter: Secondary | ICD-10-CM | POA: Insufficient documentation

## 2014-11-18 DIAGNOSIS — Z791 Long term (current) use of non-steroidal anti-inflammatories (NSAID): Secondary | ICD-10-CM | POA: Insufficient documentation

## 2014-11-18 DIAGNOSIS — Y9389 Activity, other specified: Secondary | ICD-10-CM | POA: Diagnosis not present

## 2014-11-18 DIAGNOSIS — S0592XA Unspecified injury of left eye and orbit, initial encounter: Secondary | ICD-10-CM | POA: Diagnosis present

## 2014-11-18 MED ORDER — POLYMYXIN B-TRIMETHOPRIM 10000-0.1 UNIT/ML-% OP SOLN: 1.0000 [drp] | OPHTHALMIC | Status: DC

## 2014-11-18 MED ORDER — TETRACAINE HCL 0.5 % OP SOLN
1.0000 [drp] | Freq: Once | OPHTHALMIC | Status: AC
  Administered 2014-11-18: 1 [drp] via OPHTHALMIC
  Filled 2014-11-18: qty 2

## 2014-11-18 MED ORDER — KETOROLAC TROMETHAMINE 0.5 % OP SOLN: 1.0000 [drp] | Freq: Four times a day (QID) | OPHTHALMIC | Status: DC

## 2014-11-18 MED ORDER — FLUORESCEIN SODIUM 1 MG OP STRP
1.0000 | ORAL_STRIP | Freq: Once | OPHTHALMIC | Status: AC
  Administered 2014-11-18: 1 via OPHTHALMIC
  Filled 2014-11-18: qty 1

## 2014-11-18 NOTE — ED Notes (Signed)
Per pt her 25 month old poked her in the left eye causing pain and eye to water.  Pt denies diplopia, +blurry.

## 2014-11-18 NOTE — ED Provider Notes (Signed)
CSN: [id number]     Arrival date & time 11/18/14  2138 History  By signing my name below, I, Octavia Heir, attest that this documentation has been prepared under the direction and in the presence of Sharilyn Sites, PA-C. Electronically Signed: Octavia Heir, ED Scribe. 11/18/2014. 10:36 PM.     Chief Complaint  Patient presents with  . Eye Pain      The history is provided by the patient. No language interpreter was used.   HPI Comments: Natalie English is a 29 y.o. female who presents to the Emergency Department complaining of constant, gradual worsening left eye pain onset this afternoon after being poked in the eye by 45 month old child. She has associated watery eyes. She notes her vision is somewhat blurry but she suspects it is due to water in her eyes. She denies any other visual disturbances. Patient wears glasses/contacts for reading, not currently wearing them.  VSS.  Past Medical History  Diagnosis Date  . Medical history non-contributory   . Postpartum care following vaginal delivery (5/6) 06/22/2014   Past Surgical History  Procedure Laterality Date  . No past surgeries     History reviewed. No pertinent family history. Social History  Substance Use Topics  . Smoking status: Never Smoker   . Smokeless tobacco: Never Used  . Alcohol Use: No   OB History    Gravida Para Term Preterm AB TAB SAB Ectopic Multiple Living   0 1     Review of Systems  Eyes: Positive for pain and discharge (watery eyes).  All other systems reviewed and are negative.     Allergies  Review of patient's allergies indicates no known allergies.  Home Medications   Prior to Admission medications   Medication Sig Start Date End Date Taking? Authorizing Provider  ibuprofen (ADVIL,MOTRIN) 600 MG tablet Take 1 tablet (600 mg total) by mouth every 6 (six) hours. 06/24/14   Lawernce Pitts, CNM  Prenatal Vit-Fe Fumarate-FA (PRENATAL MULTIVITAMIN) TABS tablet Take 1 tablet by mouth  daily at 12 noon.    Historical Provider, MD   Triage vitals: BP 138/74 mmHg  Pulse 61  Temp(Src) 97.8 F (36.6 C) (Oral)  Resp 20  Ht  (1.651 m)  Wt 218 lb (98.884 kg)  BMI 36.28 kg/m2  SpO2 100%  LMP 11/05/2014 (Approximate) Physical Exam  Constitutional: She is oriented to person, place, and time. She appears well-developed and well-nourished. No distress.  HENT:  Head: Normocephalic and atraumatic.  Mouth/Throat: Oropharynx is clear and moist.  Eyes: EOM and lids are normal. Pupils are equal, round, and reactive to light. Left conjunctiva is injected. Left conjunctiva has no hemorrhage. Left eye exhibits normal extraocular motion and no nystagmus.  Fundoscopic exam:      The left eye shows no exudate.  Slit lamp exam:      The left eye shows corneal abrasion. The left eye shows no corneal flare, no corneal ulcer, no foreign body and no fluorescein uptake.  Lids normal in appearance bilaterally without erythema or edema; left conjunctiva injected, eye tearing; no evidence of retained FB; EOMs intact and non-painful; no signs of entrapment Fluorescein stain with corneal abrasion on left; no ulcer or uptake  Neck: Normal range of motion. Neck supple.  Cardiovascular: Normal rate, regular rhythm and normal heart sounds.   Pulmonary/Chest: Effort normal and breath sounds normal. No respiratory distress. She has no wheezes.  Abdominal: Soft. Bowel sounds are  normal. There is no tenderness. There is no guarding.  Musculoskeletal: Normal range of motion.  Neurological: She is alert and oriented to person, place, and time.  Skin: Skin is warm and dry. She is not diaphoretic.  Psychiatric: She has a normal mood and affect.  Nursing note and vitals reviewed.   ED Course  Procedures  DIAGNOSTIC STUDIES: Oxygen Saturation is 100% on RA, normal by my interpretation.  COORDINATION OF CARE:  10:34 PM Discussed treatment plan with pt at bedside and pt agreed to plan.  Labs Review   Labs Reviewed - No data to display  Imaging Review No results found. I have personally reviewed and evaluated these images and lab results as part of my medical decision-making.   EKG Interpretation None      MDM   Final diagnoses:  Corneal abrasion, left, initial encounter   28 year old female with left eye injury after being poked in the eye.  Eye is watering on exam, conjunctiva injected. Fluorescein stain does reveal corneal abrasion, no ulcer or uptake. Patient will be started on Polytrim and Acular drops. She is instructed not to wear contacts for the next week. She will follow-up with her eye doctor.  Discussed plan with patient, he/she acknowledged understanding and agreed with plan of care.  Return precautions given for new or worsening symptoms.  I personally performed the services described in this documentation, which was scribed in my presence. The recorded information has been reviewed and is accurate.  Garlon Hatchet, PA-C 11/18/14 2358  Raeford Razor, MD 11/21/14 [phone]

## 2014-11-18 NOTE — Discharge Instructions (Signed)
Take the prescribed medication as directed.  Do not wear contacts for the next week, wear glasses only. Follow-up with your eye doctor if any problems arise. Return to the ED for new or worsening symptoms.

## 2015-02-15 ENCOUNTER — Encounter (HOSPITAL_COMMUNITY): Payer: Self-pay | Admitting: *Deleted

## 2015-02-15 ENCOUNTER — Emergency Department (HOSPITAL_COMMUNITY)
Admission: EM | Admit: 2015-02-15 | Discharge: 2015-02-15 | Disposition: A | Discharge status: Home / Self Care | Payer: BC Managed Care – PPO | Attending: Emergency Medicine | Admitting: Emergency Medicine

## 2015-02-15 DIAGNOSIS — J029 Acute pharyngitis, unspecified: Secondary | ICD-10-CM | POA: Diagnosis not present

## 2015-02-15 DIAGNOSIS — H66001 Acute suppurative otitis media without spontaneous rupture of ear drum, right ear: Secondary | ICD-10-CM | POA: Diagnosis not present

## 2015-02-15 DIAGNOSIS — H9201 Otalgia, right ear: Secondary | ICD-10-CM | POA: Diagnosis present

## 2015-02-15 DIAGNOSIS — Z79899 Other long term (current) drug therapy: Secondary | ICD-10-CM | POA: Insufficient documentation

## 2015-02-15 LAB — RAPID STREP SCREEN (MED CTR MEBANE ONLY): Streptococcus, Group A Screen (Direct): NEGATIVE

## 2015-02-15 MED ORDER — PREDNISONE 50 MG PO TABS: ORAL_TABLET | ORAL | Status: DC

## 2015-02-15 MED ORDER — PREDNISONE 20 MG PO TABS
60.0000 mg | ORAL_TABLET | Freq: Once | ORAL | Status: AC
  Administered 2015-02-15: 60 mg via ORAL
  Filled 2015-02-15: qty 3

## 2015-02-15 MED ORDER — AMOXICILLIN 500 MG PO CAPS
500.0000 mg | ORAL_CAPSULE | Freq: Once | ORAL | Status: AC
  Administered 2015-02-15: 500 mg via ORAL
  Filled 2015-02-15: qty 1

## 2015-02-15 MED ORDER — AMOXICILLIN 500 MG PO CAPS: 500.0000 mg | ORAL_CAPSULE | Freq: Three times a day (TID) | ORAL | Status: DC

## 2015-02-15 NOTE — ED Provider Notes (Signed)
CSN: [id number]     Arrival date & time 02/15/15  0413 History   First MD Initiated Contact with Patient 02/15/15 0505     Chief Complaint  Patient presents with  . Sore Throat     (Consider location/radiation/quality/duration/timing/severity/associated sxs/prior Treatment) HPI  Blood pressure 154/82, pulse 92, temperature 98.1 F (36.7 C), temperature source Oral, resp. rate 18, last menstrual period 01/28/2015, SpO2 100 %, unknown if currently breastfeeding.  Natalie English is a 29 y.o. female complaining of sore throat worsening over the course of one week. States that she has a right-sided earache and reports a right anterior cervical lymph nodes noted swollen. She is taking over-the-counter Tylenol with little relief. She denies fever, chills, cough, chest pain, shortness of breath, inability to swallow her secretions, change in voice.  Past Medical History  Diagnosis Date  . Medical history non-contributory   . Postpartum care following vaginal delivery (5/6) 06/22/2014   Past Surgical History  Procedure Laterality Date  . No past surgeries     No family history on file. Social History  Substance Use Topics  . Smoking status: Never Smoker   . Smokeless tobacco: Never Used  . Alcohol Use: No   OB History    Gravida Para Term Preterm AB TAB SAB Ectopic Multiple Living   0 1     Review of Systems  10 systems reviewed and found to be negative, except as noted in the HPI.   Allergies  Review of patient's allergies indicates no known allergies.  Home Medications   Prior to Admission medications   Medication Sig Start Date End Date Taking? Authorizing Provider  acetaminophen (TYLENOL) 500 MG tablet Take 1,000 mg by mouth every 6 (six) hours as needed for mild pain.   Yes Historical Provider, MD  levonorgestrel (MIRENA) 20 MCG/24HR IUD 1 each by Intrauterine route continuous.   Yes Historical Provider, MD  Prenatal Vit-Fe Fumarate-FA (PRENATAL MULTIVITAMIN)  TABS tablet Take 1 tablet by mouth daily at 12 noon.   Yes Historical Provider, MD  amoxicillin (AMOXIL) 500 MG capsule Take 1 capsule (500 mg total) by mouth 3 (three) times daily. 02/15/15   Aneli Zara, PA-C  ibuprofen (ADVIL,MOTRIN) 600 MG tablet Take 1 tablet (600 mg total) by mouth every 6 (six) hours. Patient not taking: Reported on 11/18/2014 06/24/14   Lawernce Pitts, CNM  ketorolac (ACULAR) 0.5 % ophthalmic solution Place 1 drop into the left eye every 6 (six) hours. Patient not taking: Reported on 02/15/2015 11/18/14   Garlon Hatchet, PA-C  predniSONE (DELTASONE) 50 MG tablet Take 1 tablet daily with breakfast 02/15/15   Natalie Reining Zynasia Burklow, PA-C  trimethoprim-polymyxin b (POLYTRIM) ophthalmic solution Place 1 drop into the left eye every 4 (four) hours. Patient not taking: Reported on 02/15/2015 11/18/14   Garlon Hatchet, PA-C   BP 154/82 mmHg  Pulse 92  Temp(Src) 98.1 F (36.7 C) (Oral)  Resp 18  SpO2 100%  LMP 01/28/2015 Physical Exam  Constitutional: She is oriented to person, place, and time. She appears well-developed and well-nourished. No distress.  HENT:  Head: Normocephalic and atraumatic.  Mouth/Throat: Oropharynx is clear and moist.  Posterior pharynx is difficult to visualize: Mallampati class IV, swelling her secretions without issue, normal phonation.   Right tympanic membrane is erythematous and bulging, no light reflex.  Focal,  Tender,  right-sided anterior cervical lymphadenopathy   Eyes: Conjunctivae and EOM are normal. Pupils are equal, round, and reactive  to light.  Neck: Normal range of motion.  Cardiovascular: Normal rate, regular rhythm and intact distal pulses.   Pulmonary/Chest: Effort normal and breath sounds normal.  Abdominal: Soft. There is no tenderness.  Musculoskeletal: Normal range of motion.  Neurological: She is alert and oriented to person, place, and time.  Skin: She is not diaphoretic.  Psychiatric: She has a normal mood and  affect.  Nursing note and vitals reviewed.   ED Course  Procedures (including critical care time) Labs Review Labs Reviewed  RAPID STREP SCREEN (NOT [facility])  CULTURE, GROUP A STREP    Imaging Review No results found. I have personally reviewed and evaluated these images and lab results as part of my medical decision-making.   EKG Interpretation None      MDM   Final diagnoses:  Acute suppurative otitis media of right ear without spontaneous rupture of tympanic membrane, recurrence not specified    Filed Vitals:   02/15/15 0435  BP: 154/82  Pulse: 92  Temp: 98.1 F (36.7 C)  TempSrc: Oral  Resp: 18  SpO2: 100%    Medications  amoxicillin (AMOXIL) capsule 500 mg (not administered)  predniSONE (DELTASONE) tablet 60 mg (not administered)    Natalie NanasShannon Morua is 29 y.o. female presenting with  sore throat worsening over the course of one week, her posterior pharynx is very difficult to visualize however she is handling her secretions and has normal phonation. She has a focal large tender lymph node on the right anterior cervical area likely secondary to her right-sided otitis media. Patient will be started on amoxicillin and prednisone for comfort.  Evaluation does not show pathology that would require ongoing emergent intervention or inpatient treatment. Pt is hemodynamically stable and mentating appropriately. Discussed findings and plan with patient/guardian, who agrees with care plan. All questions answered. Return precautions discussed and outpatient follow up given.   New Prescriptions   AMOXICILLIN (AMOXIL) 500 MG CAPSULE    Take 1 capsule (500 mg total) by mouth 3 (three) times daily.   PREDNISONE (DELTASONE) 50 MG TABLET    Take 1 tablet daily with breakfast        Wynetta Emeryicole Raney Koeppen, PA-C 02/15/15 [id number]  Zadie Rhineonald Wickline, MD 02/15/15 [phone]

## 2015-02-15 NOTE — Discharge Instructions (Signed)
°  Take your antibiotics as directed and to completion. You should never have any leftover antibiotics! Push fluids and stay well hydrated.   Any antibiotic use can reduce the efficacy of hormonal birth control. Please use back up method of contraception.    Otitis Media, Adult Otitis media is redness, soreness, and inflammation of the middle ear. Otitis media may be caused by allergies or, most commonly, by infection. Often it occurs as a complication of the common cold. SIGNS AND SYMPTOMS Symptoms of otitis media may include:  Earache.  Fever.  Ringing in your ear.  Headache.  Leakage of fluid from the ear. DIAGNOSIS To diagnose otitis media, your health care provider will examine your ear with an otoscope. This is an instrument that allows your health care provider to see into your ear in order to examine your eardrum. Your health care provider also will ask you questions about your symptoms. TREATMENT  Typically, otitis media resolves on its own within 3-5 days. Your health care provider may prescribe medicine to ease your symptoms of pain. If otitis media does not resolve within 5 days or is recurrent, your health care provider may prescribe antibiotic medicines if he or she suspects that a bacterial infection is the cause. HOME CARE INSTRUCTIONS   If you were prescribed an antibiotic medicine, finish it all even if you start to feel better.  Take medicines only as directed by your health care provider.  Keep all follow-up visits as directed by your health care provider. SEEK MEDICAL CARE IF:  You have otitis media only in one ear, or bleeding from your nose, or both.  You notice a lump on your neck.  You are not getting better in 3-5 days.  You feel worse instead of better. SEEK IMMEDIATE MEDICAL CARE IF:   You have pain that is not controlled with medicine.  You have swelling, redness, or pain around your ear or stiffness in your neck.  You notice that part of your  face is paralyzed.  You notice that the bone behind your ear (mastoid) is tender when you touch it. MAKE SURE YOU:   Understand these instructions.  Will watch your condition.  Will get help right away if you are not doing well or get worse.   This information is not intended to replace advice given to you by your health care provider. Make sure you discuss any questions you have with your health care provider.   Document Released: 11/08/2003 Document Revised: 02/23/2014 Document Reviewed: 08/30/2012 Elsevier Interactive Patient Education [employer]2016 [employer]

## 2015-02-15 NOTE — ED Notes (Signed)
Pt states that she has had a sore throat for approx 1 week; pt states that it has gotten worse over the last 24hrs; pt c/o of her throat "feeling swollen" to the right side; pt also c/o rt earache

## 2015-02-17 LAB — CULTURE, GROUP A STREP: STREP A CULTURE: NEGATIVE

## 2015-03-14 ENCOUNTER — Encounter (HOSPITAL_COMMUNITY): Payer: Self-pay | Admitting: Emergency Medicine

## 2015-03-14 ENCOUNTER — Emergency Department (HOSPITAL_COMMUNITY)
Admission: EM | Admit: 2015-03-14 | Discharge: 2015-03-14 | Disposition: A | Discharge status: Home / Self Care | Payer: BC Managed Care – PPO | Attending: Emergency Medicine | Admitting: Emergency Medicine

## 2015-03-14 DIAGNOSIS — Z7952 Long term (current) use of systemic steroids: Secondary | ICD-10-CM | POA: Diagnosis not present

## 2015-03-14 DIAGNOSIS — H9201 Otalgia, right ear: Secondary | ICD-10-CM | POA: Diagnosis present

## 2015-03-14 DIAGNOSIS — Z79899 Other long term (current) drug therapy: Secondary | ICD-10-CM | POA: Insufficient documentation

## 2015-03-14 DIAGNOSIS — R0981 Nasal congestion: Secondary | ICD-10-CM | POA: Diagnosis not present

## 2015-03-14 DIAGNOSIS — H66004 Acute suppurative otitis media without spontaneous rupture of ear drum, recurrent, right ear: Secondary | ICD-10-CM | POA: Diagnosis not present

## 2015-03-14 DIAGNOSIS — Z792 Long term (current) use of antibiotics: Secondary | ICD-10-CM | POA: Diagnosis not present

## 2015-03-14 MED ORDER — AMOXICILLIN-POT CLAVULANATE 875-125 MG PO TABS: 1.0000 | ORAL_TABLET | Freq: Two times a day (BID) | ORAL | Status: DC

## 2015-03-14 NOTE — ED Notes (Signed)
Bed: WA26 Expected date:  Expected time:  Means of arrival:  Comments: 

## 2015-03-14 NOTE — ED Provider Notes (Signed)
CSN: [id number]     Arrival date & time 03/14/15  4098 History   First MD Initiated Contact with Patient 03/14/15 1013     Chief Complaint  Patient presents with  . Otalgia     (Consider location/radiation/quality/duration/timing/severity/associated sxs/prior Treatment) HPI   30 year old female presents for evaluation of ear pain. Pt c/o R ear pain for 1 day.  Report ear fullness, sinus congestion, sore throat, cough but primary complaint is R ear pain. Pain is sharp and throbbing. No hearing changes or ringing in ears. No jaw pain or dental pain. Denies fever or chills. Report sxs felt similar to sxs she has at the beginning of the year which was improved with amoxil prescribed in the ER.    Past Medical History  Diagnosis Date  . Medical history non-contributory   . Postpartum care following vaginal delivery (5/6) 06/22/2014   Past Surgical History  Procedure Laterality Date  . No past surgeries     No family history on file. Social History  Substance Use Topics  . Smoking status: Never Smoker   . Smokeless tobacco: Never Used  . Alcohol Use: No   OB History    Gravida Para Term Preterm AB TAB SAB Ectopic Multiple Living   0 1     Review of Systems  Constitutional: Negative for fever.  HENT: Positive for congestion and ear pain. Negative for hearing loss.   Skin: Negative for rash and wound.      Allergies  Review of patient's allergies indicates no known allergies.  Home Medications   Prior to Admission medications   Medication Sig Start Date End Date Taking? Authorizing Provider  acetaminophen (TYLENOL) 500 MG tablet Take 1,000 mg by mouth every 6 (six) hours as needed for mild pain.    Historical Provider, MD  amoxicillin (AMOXIL) 500 MG capsule Take 1 capsule (500 mg total) by mouth 3 (three) times daily. 02/15/15   Nicole Pisciotta, PA-C  ibuprofen (ADVIL,MOTRIN) 600 MG tablet Take 1 tablet (600 mg total) by mouth every 6 (six) hours. Patient not  taking: Reported on 11/18/2014 06/24/14   Lawernce Pitts, CNM  ketorolac (ACULAR) 0.5 % ophthalmic solution Place 1 drop into the left eye every 6 (six) hours. Patient not taking: Reported on 02/15/2015 11/18/14   Garlon Hatchet, PA-C  levonorgestrel [facility]) 20 MCG/24HR IUD 1 each by Intrauterine route continuous.    Historical Provider, MD  predniSONE (DELTASONE) 50 MG tablet Take 1 tablet daily with breakfast 02/15/15   Wynetta Emery, PA-C  Prenatal Vit-Fe Fumarate-FA (PRENATAL MULTIVITAMIN) TABS tablet Take 1 tablet by mouth daily at 12 noon.    Historical Provider, MD  trimethoprim-polymyxin b (POLYTRIM) ophthalmic solution Place 1 drop into the left eye every 4 (four) hours. Patient not taking: Reported on 02/15/2015 11/18/14   Garlon Hatchet, PA-C   BP 130/77 mmHg  Pulse 68  Temp(Src) 98 F (36.7 C) (Oral)  Resp 16  SpO2 98%  LMP 03/07/2015 Physical Exam  Constitutional: She appears well-developed and well-nourished. No distress.  HENT:  Head: Atraumatic.  Ears: Right TM is bulging, erythematous but no evidence of TM perforation. Normal ear canal. Left TM is normal in appearance Nose: Normal nares Throat: Uvula is midline, no tonsillar enlargement or exudates.  Eyes: Conjunctivae are normal.  Neck: Neck supple.  Lymphadenopathy:    She has no cervical adenopathy.  Neurological: She is alert.  Skin: No rash noted.  Psychiatric: She  has a normal mood and affect.  Nursing note and vitals reviewed.   ED Course  Procedures (including critical care time)   MDM   Final diagnoses:  Recurrent acute suppurative otitis media of right ear without spontaneous rupture of tympanic membrane    BP 130/77 mmHg  Pulse 68  Temp(Src) 98 F (36.7 C) (Oral)  Resp 16  SpO2 98%  LMP 03/07/2015   10:32 AM Patient symptoms and finding is consistent with otitis media. Given that she had a recent ear infection and was on amoxicillin, it is prudent to aspirate her antibiotic to  Augmentin for better coverage. Return precaution discussed.  Fayrene Helper, PA-C 03/14/15 1037  Benjiman Core, MD 03/14/15 1536

## 2015-03-14 NOTE — Discharge Instructions (Signed)

## 2015-03-14 NOTE — ED Notes (Signed)
Per pt, states right ear pain since yesterday

## 2015-08-22 ENCOUNTER — Emergency Department (HOSPITAL_COMMUNITY)
Admission: EM | Admit: 2015-08-22 | Discharge: 2015-08-22 | Disposition: A | Discharge status: Home / Self Care | Payer: BC Managed Care – PPO | Attending: Emergency Medicine | Admitting: Emergency Medicine

## 2015-08-22 ENCOUNTER — Encounter (HOSPITAL_COMMUNITY): Payer: Self-pay | Admitting: Emergency Medicine

## 2015-08-22 DIAGNOSIS — J36 Peritonsillar abscess: Secondary | ICD-10-CM | POA: Insufficient documentation

## 2015-08-22 DIAGNOSIS — J029 Acute pharyngitis, unspecified: Secondary | ICD-10-CM | POA: Diagnosis present

## 2015-08-22 DIAGNOSIS — Z79899 Other long term (current) drug therapy: Secondary | ICD-10-CM | POA: Diagnosis not present

## 2015-08-22 NOTE — ED Provider Notes (Signed)
CSN: [id number]     Arrival date & time 08/22/15  1315 History  By signing my name below, I, Natalie English, attest that this documentation has been prepared under the direction and in the presence of Arthor CaptainAbigail Rilley Stash, PA-C. Electronically Signed: Phillis HaggisGabriella English, ED Scribe. 08/22/2015. 1:50 PM.   Chief Complaint  Patient presents with  . Sore Throat  . Oral Swelling    swelling in r/side back of throat   The history is provided by the patient. No language interpreter was used.  HPI Comments: Dimple NanasShannon Bobby is a 30 y.o. female who presents to the Emergency Department complaining of a gradually worsening sore throat onset 2 days ago. Pt reports associated voice change. Pt states there is worsening pain with drinking water and swallowing. Pt recently had an ear infection. She has not taken anything for her symptoms. Pt denies fever, chills, trouble swallowing, congestion, cough, nausea or vomiting.  Past Medical History  Diagnosis Date  . Medical history non-contributory   . Postpartum care following vaginal delivery (5/6) 06/22/2014   Past Surgical History  Procedure Laterality Date  . No past surgeries     No family history on file. Social History  Substance Use Topics  . Smoking status: Never Smoker   . Smokeless tobacco: Never Used  . Alcohol Use: No   OB History    Gravida Para Term Preterm AB TAB SAB Ectopic Multiple Living   2 2 2       0 1     Review of Systems  Constitutional: Negative for fever and chills.  HENT: Positive for sore throat and voice change. Negative for congestion and trouble swallowing.   Respiratory: Negative for cough.   Gastrointestinal: Negative for nausea and vomiting.    Allergies  Review of patient's allergies indicates no known allergies.  Home Medications   Prior to Admission medications   Medication Sig Start Date End Date Taking? Authorizing Provider  acetaminophen (TYLENOL) 500 MG tablet Take 1,000 mg by mouth every 6 (six) hours as needed  for mild pain.    Historical Provider, MD  amoxicillin-clavulanate (AUGMENTIN) 875-125 MG tablet Take 1 tablet by mouth 2 (two) times daily. One po bid x 7 days 03/14/15   Fayrene HelperBowie Tran, PA-C  ibuprofen (ADVIL,MOTRIN) 600 MG tablet Take 1 tablet (600 mg total) by mouth every 6 (six) hours. Patient not taking: Reported on 11/18/2014 06/24/14   Donette LarryMelanie Bhambri, CNM  ketorolac (ACULAR) 0.5 % ophthalmic solution Place 1 drop into the left eye every 6 (six) hours. Patient not taking: Reported on 02/15/2015 11/18/14   [name], PA-C  levonorgestrel [facility](MIRENA) 20 MCG/24HR IUD 1 each by Intrauterine route continuous.    Historical Provider, MD  predniSONE (DELTASONE) 50 MG tablet Take 1 tablet daily with breakfast 02/15/15   Wynetta EmeryNicole Pisciotta, PA-C  Prenatal Vit-Fe Fumarate-FA (PRENATAL MULTIVITAMIN) TABS tablet Take 1 tablet by mouth daily at 12 noon.    Historical Provider, MD  trimethoprim-polymyxin b (POLYTRIM) ophthalmic solution Place 1 drop into the left eye every 4 (four) hours. Patient not taking: Reported on 02/15/2015 11/18/14   [name], PA-C   BP 110/70 mmHg  Pulse 80  Temp(Src) 98.8 F (37.1 C) (Oral)  Resp 17  SpO2 100% Physical Exam  Constitutional: She is oriented to person, place, and time. She appears well-developed and well-nourished.  HENT:  Head: Normocephalic and atraumatic.  Mouth/Throat: Mucous membranes are normal. Posterior oropharyngeal edema, posterior oropharyngeal erythema and tonsillar abscesses present.  Right sided swelling,  erythema from the right pharyngeal arch to the uvula, uvula beginning to deviate to the left; peritonsillar abscess  Eyes: Conjunctivae and EOM are normal. Pupils are equal, round, and reactive to light.  Neck: Normal range of motion. Neck supple.  Right neck: Swelling externally from submental region  Musculoskeletal: Normal range of motion.  Neurological: She is alert and oriented to person, place, and time.  Skin: Skin is warm and dry.   Psychiatric: She has a normal mood and affect. Her behavior is normal.  Nursing note and vitals reviewed.   ED Course  Procedures (including critical care time) DIAGNOSTIC STUDIES: Oxygen Saturation is 100% on RA, normal by my interpretation.    COORDINATION OF CARE: 1:39 PM-Discussed treatment plan which includes CT scan with pt at bedside and pt agreed to plan.   1:47 PM- talked to Dr. Denton LankSteinl and will consult with ENT to see if they want to have the pt come in for further evaluation.  Labs Review Labs Reviewed - No data to display  Imaging Review No results found. I have personally reviewed and evaluated these images and lab results as part of my medical decision-making.   EKG Interpretation None      MDM    Final diagnoses:  Peritonsillar abscess    Patient with PTA.  Spoke with Dr. Jenne PaneBates who asks to have the patient sent over at this time. Airway is patent and patient is tolerating her own secretions. She is HDS and afebrile. Appears safe for dischare at this time. I personally performed the services described in this documentation, which was scribed in my presence. The recorded information has been reviewed and is accurate.         Arthor CaptainAbigail Bronwen Pendergraft, PA-C 08/22/15 1412  Cathren LaineKevin Steinl, MD 08/24/15 [phone]

## 2015-08-22 NOTE — Discharge Instructions (Signed)
Peritonsillar Abscess °A peritonsillar abscess is a collection of yellowish-white fluid (pus) in the back of the throat behind the tonsils. It usually occurs when an infection of the throat or tonsils (tonsillitis) spreads into the tissues around the tonsils. °CAUSES °The infection that leads to a peritonsillar abscess is usually caused by streptococcal bacteria.  °SIGNS AND SYMPTOMS °· Sore throat, often with pain on just one side. °· Swelling and tenderness of the glands (lymph nodes) in the neck. °· Difficulty swallowing. °· Difficulty opening your mouth. °· Fever. °· Chills. °· Drooling because of difficulty swallowing saliva. °· Headache. °· Changes in your voice. °· Bad breath. °DIAGNOSIS °Your health care provider will take your medical history and do a physical exam. Imaging tests may be done, such as an ultrasound or CT scan. A sample of pus may be removed from the abscess using a needle (needle aspiration) or by swabbing the back of your throat. This sample will be sent to a lab for testing. °TREATMENT °Treatment usually involves draining the pus from the abscess. This may be done through needle aspiration or by making an incision in the abscess. You will also likely need to take antibiotic medicine. °HOME CARE INSTRUCTIONS °· Rest as much as possible and get plenty of sleep. °· Take medicines only as directed by your health care provider. °· If you were prescribed an antibiotic medicine, finish it all even if you start to feel better. °· If your abscess was drained by your health care provider, gargle with a mixture of salt and warm water: °¨ Mix 1 tsp of salt in 8 oz of warm water. °¨ Gargle with this mixture four times per day or as needed for comfort. °¨ Do not swallow this mixture. °· Drink plenty of fluids. °· While your throat is sore, eat soft or liquid foods, such as frozen ice pops and ice cream. °· Keep all follow-up visits as directed by your health care provider. This is important. °SEEK  MEDICAL CARE IF: °· You have increased pain, swelling, redness, or drainage in your throat. °· You develop a headache, a lack of energy (lethargy), or generalized feelings of illness. °· You have a fever. °· You feel dizzy. °· You have difficulty swallowing or eating. °· You show signs of becoming dehydrated, such as: °¨ Light-headedness when standing. °¨ Decreased urine output. °¨ A fast heart rate. °¨ Dry mouth. °SEEK IMMEDIATE MEDICAL CARE IF:  °· You have difficulty talking or breathing, or you find it easier to breathe when you lean forward. °· You are coughing up blood or vomiting blood. °· You have severe throat pain that is not helped by medicines. °· You start to drool. °  °This information is not intended to replace advice given to you by your health care provider. Make sure you discuss any questions you have with your health care provider. °  °Document Released: 02/02/2005 Document Revised: 02/23/2014 Document Reviewed: 09/18/2013 °Elsevier Interactive Patient Education ©2016 [employer] ° °

## 2015-08-22 NOTE — ED Notes (Signed)
Pt c/o severe r/throat pain x 2 days. Per PA- swelling appreciated on r/side of neck. Swelling noted in r/posterior throat. Pt is hoarse, able to manage oral secretions. Pt did not attempt to medicate with OTC meds

## 2015-08-24 ENCOUNTER — Emergency Department (HOSPITAL_COMMUNITY)
Admission: EM | Admit: 2015-08-24 | Discharge: 2015-08-24 | Disposition: A | Discharge status: Home / Self Care | Payer: BC Managed Care – PPO | Attending: Emergency Medicine | Admitting: Emergency Medicine

## 2015-08-24 ENCOUNTER — Encounter (HOSPITAL_COMMUNITY): Payer: Self-pay

## 2015-08-24 DIAGNOSIS — J029 Acute pharyngitis, unspecified: Secondary | ICD-10-CM | POA: Diagnosis present

## 2015-08-24 DIAGNOSIS — R070 Pain in throat: Secondary | ICD-10-CM | POA: Diagnosis not present

## 2015-08-24 DIAGNOSIS — Z79899 Other long term (current) drug therapy: Secondary | ICD-10-CM | POA: Diagnosis not present

## 2015-08-24 DIAGNOSIS — G8918 Other acute postprocedural pain: Secondary | ICD-10-CM | POA: Diagnosis not present

## 2015-08-24 NOTE — Discharge Instructions (Signed)
YOU CAN BE DISCHARGED HOME. CONTACT DR. BYERS IF NO BETTER IN 24 HOURS AS DISCUSSED WITH HIM. RETURN TO THE EMERGENCY DEPARTMENT AT ANY TIME WITH ANY NEW CONCERNS.

## 2015-08-24 NOTE — ED Provider Notes (Signed)
CSN: [id number]     Arrival date & time 08/24/15  0358 History   First MD Initiated Contact with Patient 08/24/15 [phone]     Chief Complaint  Patient presents with  . Sore Throat     (Consider location/radiation/quality/duration/timing/severity/associated sxs/prior Treatment) HPI Comments: Patient presents to the ED without complaint of sore throat. She was seen earlier in the week and diagnosed with a right PTA. She was seen in follow up by Dr. Christia Reading and had the abscess drained in the office yesterday. She comes in tonight with complaint of worsening sore throat. No fever, vomiting. She has been taking medications as prescribed in treatment of the abscess.   Patient is a 30 y.o. female presenting with pharyngitis. The history is provided by the patient. No language interpreter was used.  Sore Throat Associated symptoms include a sore throat. Pertinent negatives include no fever or nausea.    Past Medical History  Diagnosis Date  . Medical history non-contributory   . Postpartum care following vaginal delivery (5/6) 06/22/2014   Past Surgical History  Procedure Laterality Date  . No past surgeries     Family History  Problem Relation Age of Onset  . Hypertension Mother   . Hypertension Father    Social History  Substance Use Topics  . Smoking status: Never Smoker   . Smokeless tobacco: Never Used  . Alcohol Use: No   OB History    Gravida Para Term Preterm AB TAB SAB Ectopic Multiple Living   0 1     Review of Systems  Constitutional: Negative for fever.  HENT: Positive for sore throat and voice change. Negative for trouble swallowing.   Respiratory: Negative for shortness of breath.   Gastrointestinal: Negative for nausea.      Allergies  Review of patient's allergies indicates no known allergies.  Home Medications   Prior to Admission medications   Medication Sig Start Date End Date Taking? Authorizing Provider  clindamycin (CLEOCIN) 300 MG  capsule Take 300 mg by mouth 3 (three) times daily. 10 day therapy course 08/22/15 09/01/15 Yes Historical Provider, MD  levonorgestrel (MIRENA) 20 MCG/24HR IUD 1 each by Intrauterine route continuous.   Yes Historical Provider, MD  Prenatal Vit-Fe Fumarate-FA (PRENATAL MULTIVITAMIN) TABS tablet Take 1 tablet by mouth daily at 12 noon.   Yes Historical Provider, MD   BP 140/83 mmHg  Pulse 84  Temp(Src) 98.8 F (37.1 C) (Oral)  Resp 20  Ht  (1.651 m)  Wt 100.245 kg  BMI 36.78 kg/m2  SpO2 97%  LMP 08/22/2015 (Exact Date) Physical Exam  Constitutional: She is oriented to person, place, and time. She appears well-developed and well-nourished.  HENT:  Incision visualized to right peritonsillar area. No active drainage or bleeding.   Neck: Normal range of motion.  Pulmonary/Chest: Effort normal.  Lymphadenopathy:    She has cervical adenopathy.  Neurological: She is alert and oriented to person, place, and time.  Skin: Skin is warm and dry.    ED Course  Procedures (including critical care time) Labs Review Labs Reviewed - No data to display  Imaging Review No results found. I have personally reviewed and evaluated these images and lab results as part of my medical decision-making.   EKG Interpretation None      MDM   Final diagnoses:  None    1. Post operative pain  The patient was seen in the ED by Dr. Suzanna Obey of  ENT who feels the patient is stable postoperatively and is felt appropriate fro discharge home. He instructed her to call him in the next 24 hours if no better.     Elpidio AnisShari Carren Blakley, PA-C 08/24/15 [id number]  Arby BarretteMarcy Pfeiffer, MD 08/24/15 [phone]

## 2015-08-24 NOTE — ED Notes (Signed)
Pt seen by Dr. Jearld FentonByers.

## 2015-08-24 NOTE — ED Notes (Signed)
Patient c/o sore throat.  Patient states that had a peritonsillar abscess drained on Thursday.  Patient states that within the last 30 minutes has been spitting up blood.  Patient states that has been drinking all day, no difficulties swallowing.  Patient appears in triage breathing even and unlabored.  NAD at this time.  Patient states pain 5/10.

## 2015-08-24 NOTE — Consult Note (Signed)
Reason for Consult: Sore throat Referring Physician: Emergency room  Natalie English is an 30 y.o. female.  HPI: Patient with a history of peritonsillar abscess that started Tuesday of last week. She was seen on Thursday afternoon in Dr. Jenne PaneBates performed an incision and drainage with significant amount of purulence. She this morning had a bolus of material come out of her mouth that was mixed with blood. This was only one episode. After that occurred she felt a relief of pressure on the right side. This made her nervous and she came to the emergency room. Overall she is better with respect to the sore throat but still having some discomfort. Her voice is better but still seems a bit muffled. She is taking her antibiotics. She is able to get fluids down well.  Past Medical History  Diagnosis Date  . Medical history non-contributory   . Postpartum care following vaginal delivery (5/6) 06/22/2014    Past Surgical History  Procedure Laterality Date  . No past surgeries      Family History  Problem Relation Age of Onset  . Hypertension Mother   . Hypertension Father     Social History:  reports that she has never smoked. She has never used smokeless tobacco. She reports that she does not drink alcohol or use illicit drugs.  Allergies: No Known Allergies  Medications: I have reviewed the patient's current medications.  No results found for this or any previous visit (from the past 48 hour(s)).  No results found.  ROS Blood pressure 140/83, pulse 84, temperature 98.8 F (37.1 C), temperature source Oral, resp. rate 20, height 5\' 5"  (1.651 m), weight 100.245 kg (221 lb), last menstrual period 08/22/2015, SpO2 97 %, not currently breastfeeding. Physical Exam  Constitutional: She appears well-developed and well-nourished.  HENT:  Awake and alert. She has a slightly hot potato voice but very clear. No stridor. Nose is clear. Oral cavity/oropharynx-I can see the incision site which is open.  The tonsil is swollen. The uvula is somewhat swollen as well but normal size. Left tonsil looks like it's in position without significant inflammation. She opens her mouth well. Neck is without mass or adenopathy.  Neck: Normal range of motion.    Assessment/Plan: Peritonsillar abscess-after having a discussion with her and discussing the bolus of material that came out of her mouth I suspect that this was the wound emptying further. This episode actually made the right side feel less pressure and better. After she understood this she actually felt better and was not interested in admission nor a CAT scan. She would prefer to continue the medication and will call me if she is not significantly improved in another 24 hours.  Natalie English, Natalie English 08/24/2015, 5:14 AM

## 2019-10-25 DIAGNOSIS — Z6841 Body Mass Index (BMI) 40.0 and over, adult: Secondary | ICD-10-CM | POA: Diagnosis not present

## 2019-10-25 DIAGNOSIS — N951 Menopausal and female climacteric states: Secondary | ICD-10-CM | POA: Diagnosis not present

## 2019-10-25 DIAGNOSIS — R635 Abnormal weight gain: Secondary | ICD-10-CM | POA: Diagnosis not present

## 2019-10-25 DIAGNOSIS — E782 Mixed hyperlipidemia: Secondary | ICD-10-CM | POA: Diagnosis not present

## 2019-10-25 DIAGNOSIS — E559 Vitamin D deficiency, unspecified: Secondary | ICD-10-CM | POA: Diagnosis not present

## 2019-10-31 DIAGNOSIS — Z1339 Encounter for screening examination for other mental health and behavioral disorders: Secondary | ICD-10-CM | POA: Diagnosis not present

## 2019-10-31 DIAGNOSIS — E559 Vitamin D deficiency, unspecified: Secondary | ICD-10-CM | POA: Diagnosis not present

## 2019-10-31 DIAGNOSIS — E782 Mixed hyperlipidemia: Secondary | ICD-10-CM | POA: Diagnosis not present

## 2019-10-31 DIAGNOSIS — Z1331 Encounter for screening for depression: Secondary | ICD-10-CM | POA: Diagnosis not present

## 2019-10-31 DIAGNOSIS — Z6841 Body Mass Index (BMI) 40.0 and over, adult: Secondary | ICD-10-CM | POA: Diagnosis not present

## 2019-11-09 DIAGNOSIS — E559 Vitamin D deficiency, unspecified: Secondary | ICD-10-CM | POA: Diagnosis not present

## 2019-11-09 DIAGNOSIS — Z6841 Body Mass Index (BMI) 40.0 and over, adult: Secondary | ICD-10-CM | POA: Diagnosis not present

## 2019-11-16 DIAGNOSIS — Z6841 Body Mass Index (BMI) 40.0 and over, adult: Secondary | ICD-10-CM | POA: Diagnosis not present

## 2019-11-16 DIAGNOSIS — E782 Mixed hyperlipidemia: Secondary | ICD-10-CM | POA: Diagnosis not present

## 2019-11-23 DIAGNOSIS — Z6841 Body Mass Index (BMI) 40.0 and over, adult: Secondary | ICD-10-CM | POA: Diagnosis not present

## 2019-11-23 DIAGNOSIS — E559 Vitamin D deficiency, unspecified: Secondary | ICD-10-CM | POA: Diagnosis not present

## 2019-12-01 DIAGNOSIS — E559 Vitamin D deficiency, unspecified: Secondary | ICD-10-CM | POA: Diagnosis not present

## 2019-12-01 DIAGNOSIS — E782 Mixed hyperlipidemia: Secondary | ICD-10-CM | POA: Diagnosis not present

## 2019-12-01 DIAGNOSIS — Z6841 Body Mass Index (BMI) 40.0 and over, adult: Secondary | ICD-10-CM | POA: Diagnosis not present

## 2019-12-07 DIAGNOSIS — Z6841 Body Mass Index (BMI) 40.0 and over, adult: Secondary | ICD-10-CM | POA: Diagnosis not present

## 2019-12-07 DIAGNOSIS — E559 Vitamin D deficiency, unspecified: Secondary | ICD-10-CM | POA: Diagnosis not present

## 2019-12-08 DIAGNOSIS — Z124 Encounter for screening for malignant neoplasm of cervix: Secondary | ICD-10-CM | POA: Diagnosis not present

## 2019-12-08 DIAGNOSIS — Z01419 Encounter for gynecological examination (general) (routine) without abnormal findings: Secondary | ICD-10-CM | POA: Diagnosis not present

## 2019-12-08 DIAGNOSIS — I10 Essential (primary) hypertension: Secondary | ICD-10-CM | POA: Diagnosis not present

## 2019-12-08 DIAGNOSIS — Z30431 Encounter for routine checking of intrauterine contraceptive device: Secondary | ICD-10-CM | POA: Diagnosis not present

## 2019-12-08 DIAGNOSIS — Z309 Encounter for contraceptive management, unspecified: Secondary | ICD-10-CM | POA: Diagnosis not present

## 2019-12-21 DIAGNOSIS — Z6841 Body Mass Index (BMI) 40.0 and over, adult: Secondary | ICD-10-CM | POA: Diagnosis not present

## 2019-12-21 DIAGNOSIS — E782 Mixed hyperlipidemia: Secondary | ICD-10-CM | POA: Diagnosis not present

## 2019-12-28 DIAGNOSIS — Z6841 Body Mass Index (BMI) 40.0 and over, adult: Secondary | ICD-10-CM | POA: Diagnosis not present

## 2019-12-28 DIAGNOSIS — E559 Vitamin D deficiency, unspecified: Secondary | ICD-10-CM | POA: Diagnosis not present

## 2020-01-04 DIAGNOSIS — Z6841 Body Mass Index (BMI) 40.0 and over, adult: Secondary | ICD-10-CM | POA: Diagnosis not present

## 2020-01-04 DIAGNOSIS — E782 Mixed hyperlipidemia: Secondary | ICD-10-CM | POA: Diagnosis not present

## 2020-01-18 DIAGNOSIS — E559 Vitamin D deficiency, unspecified: Secondary | ICD-10-CM | POA: Diagnosis not present

## 2020-01-18 DIAGNOSIS — Z6841 Body Mass Index (BMI) 40.0 and over, adult: Secondary | ICD-10-CM | POA: Diagnosis not present

## 2020-01-25 DIAGNOSIS — Z6839 Body mass index (BMI) 39.0-39.9, adult: Secondary | ICD-10-CM | POA: Diagnosis not present

## 2020-01-25 DIAGNOSIS — E782 Mixed hyperlipidemia: Secondary | ICD-10-CM | POA: Diagnosis not present

## 2020-02-15 DIAGNOSIS — E559 Vitamin D deficiency, unspecified: Secondary | ICD-10-CM | POA: Diagnosis not present

## 2020-02-15 DIAGNOSIS — E782 Mixed hyperlipidemia: Secondary | ICD-10-CM | POA: Diagnosis not present

## 2021-01-16 DIAGNOSIS — S0502XA Injury of conjunctiva and corneal abrasion without foreign body, left eye, initial encounter: Secondary | ICD-10-CM | POA: Diagnosis not present

## 2021-02-12 DIAGNOSIS — T161XXA Foreign body in right ear, initial encounter: Secondary | ICD-10-CM | POA: Diagnosis not present

## 2021-03-28 DIAGNOSIS — H1870 Unspecified corneal deformity: Secondary | ICD-10-CM | POA: Diagnosis not present

## 2021-03-28 DIAGNOSIS — H1032 Unspecified acute conjunctivitis, left eye: Secondary | ICD-10-CM | POA: Diagnosis not present

## 2021-06-25 DIAGNOSIS — H18212 Corneal edema secondary to contact lens, left eye: Secondary | ICD-10-CM | POA: Diagnosis not present

## 2022-01-02 ENCOUNTER — Encounter (HOSPITAL_BASED_OUTPATIENT_CLINIC_OR_DEPARTMENT_OTHER): Payer: Self-pay | Admitting: Emergency Medicine

## 2022-01-02 ENCOUNTER — Other Ambulatory Visit: Payer: Self-pay

## 2022-01-02 ENCOUNTER — Emergency Department (HOSPITAL_BASED_OUTPATIENT_CLINIC_OR_DEPARTMENT_OTHER)
Admission: EM | Admit: 2022-01-02 | Discharge: 2022-01-02 | Disposition: A | Discharge status: Home / Self Care | Payer: BC Managed Care – PPO | Attending: Emergency Medicine | Admitting: Emergency Medicine

## 2022-01-02 ENCOUNTER — Emergency Department (HOSPITAL_BASED_OUTPATIENT_CLINIC_OR_DEPARTMENT_OTHER): Payer: BC Managed Care – PPO

## 2022-01-02 DIAGNOSIS — M419 Scoliosis, unspecified: Secondary | ICD-10-CM | POA: Diagnosis not present

## 2022-01-02 DIAGNOSIS — S299XXA Unspecified injury of thorax, initial encounter: Secondary | ICD-10-CM | POA: Diagnosis not present

## 2022-01-02 DIAGNOSIS — S29012A Strain of muscle and tendon of back wall of thorax, initial encounter: Secondary | ICD-10-CM | POA: Insufficient documentation

## 2022-01-02 DIAGNOSIS — M546 Pain in thoracic spine: Secondary | ICD-10-CM | POA: Diagnosis not present

## 2022-01-02 DIAGNOSIS — S29019A Strain of muscle and tendon of unspecified wall of thorax, initial encounter: Secondary | ICD-10-CM

## 2022-01-02 DIAGNOSIS — Y92410 Unspecified street and highway as the place of occurrence of the external cause: Secondary | ICD-10-CM | POA: Insufficient documentation

## 2022-01-02 MED ORDER — METHOCARBAMOL 500 MG PO TABS: 500.0000 mg | ORAL_TABLET | Freq: Three times a day (TID) | ORAL | 0 refills | Status: AC | PRN

## 2022-01-02 MED ORDER — IBUPROFEN 800 MG PO TABS
800.0000 mg | ORAL_TABLET | Freq: Once | ORAL | Status: AC
  Administered 2022-01-02: 800 mg via ORAL
  Filled 2022-01-02: qty 1

## 2022-01-02 MED ORDER — DICLOFENAC SODIUM 1 % EX GEL: 2.0000 g | Freq: Four times a day (QID) | CUTANEOUS | 0 refills | Status: AC | PRN

## 2022-01-02 NOTE — Discharge Instructions (Signed)
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
## 2022-03-16 DIAGNOSIS — Z30432 Encounter for removal of intrauterine contraceptive device: Secondary | ICD-10-CM | POA: Diagnosis not present

## 2022-03-16 DIAGNOSIS — Z01419 Encounter for gynecological examination (general) (routine) without abnormal findings: Secondary | ICD-10-CM | POA: Diagnosis not present

## 2022-11-15 ENCOUNTER — Encounter (HOSPITAL_BASED_OUTPATIENT_CLINIC_OR_DEPARTMENT_OTHER): Payer: Self-pay

## 2022-11-15 ENCOUNTER — Other Ambulatory Visit: Payer: Self-pay

## 2022-11-15 ENCOUNTER — Emergency Department (HOSPITAL_BASED_OUTPATIENT_CLINIC_OR_DEPARTMENT_OTHER)
Admission: EM | Admit: 2022-11-15 | Discharge: 2022-11-15 | Disposition: A | Discharge status: Home / Self Care | Payer: BC Managed Care – PPO | Attending: Emergency Medicine | Admitting: Emergency Medicine

## 2022-11-15 ENCOUNTER — Telehealth (HOSPITAL_BASED_OUTPATIENT_CLINIC_OR_DEPARTMENT_OTHER): Payer: Self-pay | Admitting: Emergency Medicine

## 2022-11-15 DIAGNOSIS — R519 Headache, unspecified: Secondary | ICD-10-CM | POA: Diagnosis not present

## 2022-11-15 DIAGNOSIS — H5712 Ocular pain, left eye: Secondary | ICD-10-CM | POA: Diagnosis not present

## 2022-11-15 MED ORDER — TETRACAINE HCL 0.5 % OP SOLN
2.0000 [drp] | Freq: Once | OPHTHALMIC | Status: AC
  Administered 2022-11-15: 2 [drp] via OPHTHALMIC
  Filled 2022-11-15: qty 4

## 2022-11-15 MED ORDER — POLYMYXIN B-TRIMETHOPRIM 10000-0.1 UNIT/ML-% OP SOLN: 1.0000 [drp] | OPHTHALMIC | 0 refills | Status: AC

## 2022-11-15 MED ORDER — FLUORESCEIN SODIUM 1 MG OP STRP
1.0000 | ORAL_STRIP | Freq: Once | OPHTHALMIC | Status: AC
  Administered 2022-11-15: 1 via OPHTHALMIC
  Filled 2022-11-15: qty 1

## 2022-11-15 MED ORDER — POLYMYXIN B-TRIMETHOPRIM 10000-0.1 UNIT/ML-% OP SOLN: 1.0000 [drp] | OPHTHALMIC | 0 refills | Status: DC

## 2022-11-15 NOTE — Discharge Instructions (Signed)
Follow up with the eye doc in the office.  Call them first thing tomorrow.

## 2022-11-15 NOTE — ED Notes (Signed)
Visual acuity completed per order. Pt does not wear glasses or contacts. Reports left eye red and sore.

## 2022-11-15 NOTE — Telephone Encounter (Signed)
Pharmacy called and does not have the prescription.  Resent.

## 2022-11-15 NOTE — ED Provider Notes (Signed)
[facility] Provider Note   CSN: [id number] Arrival date & time: 11/15/22  0945     History  Chief Complaint  Patient presents with   Eye Problem    Natalie English is a 37 y.o. female.  37 yo F with a chief complaint of left eye pain.  This has been going on for about 2 or 3 days now started as some soreness she started trying some lubricating drops and had initially some improvement and then felt like things gotten worse.  She started having significant eye watering this morning and came for evaluation.  She denies woodworking welding metal grinding.     Eye Problem      Home Medications Prior to Admission medications   Medication Sig Start Date End Date Taking? Authorizing Provider  trimethoprim-polymyxin b (POLYTRIM) ophthalmic solution Place 1 drop into the right eye every 4 (four) hours. 11/15/22  Yes Melene Plan, DO  diclofenac Sodium (VOLTAREN) 1 % GEL Apply 2 g topically 4 (four) times daily as needed. 01/02/22   Long, Arlyss Repress, MD  levonorgestrel (MIRENA) 20 MCG/24HR IUD 1 each by Intrauterine route continuous.    [provider]  methocarbamol (ROBAXIN) 500 MG tablet Take 1 tablet (500 mg total) by mouth every 8 (eight) hours as needed. 01/02/22   Long, Arlyss Repress, MD  Prenatal Vit-Fe Fumarate-FA (PRENATAL MULTIVITAMIN) TABS tablet Take 1 tablet by mouth daily at 12 noon.    [provider]      Allergies    Patient has no known allergies.    Review of Systems   Review of Systems  Physical Exam Updated Vital Signs BP (!) 157/105 (BP Location: Right Arm)   Pulse 74   Temp 97.9 F (36.6 C) (Oral)   Resp 18   Ht 5\' 5"  (1.651 m)   Wt 108.9 kg   SpO2 99%   BMI 39.94 kg/m  Physical Exam Vitals and nursing note reviewed.  Constitutional:      General: She is not in acute distress.    Appearance: She is well-developed. She is not diaphoretic.  HENT:     Head: Normocephalic and atraumatic.  Eyes:      Pupils: Pupils are equal, round, and reactive to light.     Comments: Watery eye discharge on the left.  Conjunctival injection.  No obvious fluorescein uptake, there seem to be a small defect at the inferior aspect of the iris along the 5 o'clock position however with blinking seem to clear.  Difficult pressure exam even with coaching the patient.  She had a tendency to roll her eyes back in her head which made it difficult to check the pressure.  I was able to get the Tono-Pen to read 20 mmHg.  Cardiovascular:     Rate and Rhythm: Normal rate and regular rhythm.     Heart sounds: No murmur heard.    No friction rub. No gallop.  Pulmonary:     Effort: Pulmonary effort is normal.     Breath sounds: No wheezing or rales.  Abdominal:     General: There is no distension.     Palpations: Abdomen is soft.     Tenderness: There is no abdominal tenderness.  Musculoskeletal:        General: No tenderness.     Cervical back: Normal range of motion and neck supple.  Skin:    General: Skin is warm and dry.  Neurological:  Mental Status: She is alert and oriented to person, place, and time.  Psychiatric:        Behavior: Behavior normal.     ED Results / Procedures / Treatments   Labs (all labs ordered are listed, but only abnormal results are displayed) Labs Reviewed - No data to display  EKG None  Radiology No results found.  Procedures Procedures    Medications Ordered in ED Medications  fluorescein ophthalmic strip 1 strip (1 strip Left Eye Given 11/15/22 1028)  tetracaine (PONTOCAINE) 0.5 % ophthalmic solution 2 drop (2 drops Left Eye Given by Other 11/15/22 1011)    ED Course/ Medical Decision Making/ A&P                                 Medical Decision Making Risk Prescription drug management.   37 yo F with a chief complaints of left eye pain and watering.  Going on for 2 to 3 days.  No obvious injury to it.  Patient without obvious foreign body or corneal  abrasion.  She is having quite a bit of watery discharge.  Having a bit of photophobia and consensual photophobia as well.  Could be iritis.  Starting on topical antibiotics.  Will have her see the ophthalmologist tomorrow.  10:37 AM:  I have discussed the diagnosis/risks/treatment options with the patient.  Evaluation and diagnostic testing in the emergency department does not suggest an emergent condition requiring admission or immediate intervention beyond what has been performed at this time.  They will follow up with Optho. We also discussed returning to the ED immediately if new or worsening sx occur. We discussed the sx which are most concerning (e.g., sudden worsening pain, fever, inability to tolerate by mouth) that necessitate immediate return. Medications administered to the patient during their visit and any new prescriptions provided to the patient are listed below.  Medications given during this visit Medications  fluorescein ophthalmic strip 1 strip (1 strip Left Eye Given 11/15/22 1028)  tetracaine (PONTOCAINE) 0.5 % ophthalmic solution 2 drop (2 drops Left Eye Given by Other 11/15/22 1011)     The patient appears reasonably screen and/or stabilized for discharge and I doubt any other medical condition or other [facility] requiring further screening, evaluation, or treatment in the ED at this time prior to discharge.          Final Clinical Impression(s) / ED Diagnoses Final diagnoses:  Left eye pain    Rx / DC Orders ED Discharge Orders          Ordered    trimethoprim-polymyxin b (POLYTRIM) ophthalmic solution  Every 4 hours        11/15/22 1033              Melene Plan, DO 11/15/22 1037

## 2022-11-15 NOTE — ED Triage Notes (Signed)
Pt presents pov with complaints of dryness to the left eye that started 2-3 days ago. Reports redness and excessive watering that started last night. Denies injury to the eye. Also reports headache.
